# Patient Record
Sex: Female | Born: 1950 | Race: White | Hispanic: No | Marital: Married | Smoking: Never smoker
Health system: Southern US, Community
[De-identification: ages and names within clinical notes are randomized; demographics above are authoritative.]

## PROBLEM LIST (undated history)

## (undated) DIAGNOSIS — G709 Myoneural disorder, unspecified: Secondary | ICD-10-CM

## (undated) DIAGNOSIS — I10 Essential (primary) hypertension: Secondary | ICD-10-CM

## (undated) DIAGNOSIS — E079 Disorder of thyroid, unspecified: Secondary | ICD-10-CM

## (undated) DIAGNOSIS — H269 Unspecified cataract: Secondary | ICD-10-CM

## (undated) DIAGNOSIS — E785 Hyperlipidemia, unspecified: Secondary | ICD-10-CM

## (undated) DIAGNOSIS — L80 Vitiligo: Secondary | ICD-10-CM

## (undated) DIAGNOSIS — Z9221 Personal history of antineoplastic chemotherapy: Secondary | ICD-10-CM

## (undated) DIAGNOSIS — T7840XA Allergy, unspecified, initial encounter: Secondary | ICD-10-CM

## (undated) DIAGNOSIS — F419 Anxiety disorder, unspecified: Secondary | ICD-10-CM

## (undated) DIAGNOSIS — M1990 Unspecified osteoarthritis, unspecified site: Secondary | ICD-10-CM

## (undated) DIAGNOSIS — D649 Anemia, unspecified: Secondary | ICD-10-CM

## (undated) DIAGNOSIS — N19 Unspecified kidney failure: Secondary | ICD-10-CM

## (undated) HISTORY — DX: Hyperlipidemia, unspecified: E78.5

## (undated) HISTORY — DX: Essential (primary) hypertension: I10

## (undated) HISTORY — DX: Myoneural disorder, unspecified: G70.9

## (undated) HISTORY — DX: Vitiligo: L80

## (undated) HISTORY — PX: KNEE SURGERY: SHX244

## (undated) HISTORY — DX: Unspecified osteoarthritis, unspecified site: M19.90

## (undated) HISTORY — DX: Disorder of thyroid, unspecified: E07.9

## (undated) HISTORY — PX: CATARACT EXTRACTION, BILATERAL: SHX1313

## (undated) HISTORY — DX: Anxiety disorder, unspecified: F41.9

## (undated) HISTORY — DX: Anemia, unspecified: D64.9

## (undated) HISTORY — PX: PORTA CATH INSERTION: CATH118285

## (undated) HISTORY — DX: Unspecified cataract: H26.9

## (undated) HISTORY — PX: PORT-A-CATH REMOVAL: SHX5289

## (undated) HISTORY — PX: POLYPECTOMY: SHX149

## (undated) HISTORY — DX: Personal history of antineoplastic chemotherapy: Z92.21

## (undated) HISTORY — DX: Unspecified kidney failure: N19

## (undated) HISTORY — DX: Allergy, unspecified, initial encounter: T78.40XA

## (undated) HISTORY — PX: COLONOSCOPY: SHX174

---

## 1974-07-23 HISTORY — PX: TUBAL LIGATION: SHX77

## 1999-07-24 HISTORY — PX: CHOLECYSTECTOMY: SHX55

## 2007-07-24 DIAGNOSIS — N19 Unspecified kidney failure: Secondary | ICD-10-CM

## 2007-07-24 HISTORY — DX: Unspecified kidney failure: N19

## 2009-07-23 HISTORY — PX: REPLACEMENT TOTAL KNEE: SUR1224

## 2010-01-30 ENCOUNTER — Encounter: Admission: RE | Admit: 2010-01-30 | Discharge: 2010-04-20 | Payer: Self-pay | Admitting: Orthopedic Surgery

## 2011-04-16 DIAGNOSIS — Z96659 Presence of unspecified artificial knee joint: Secondary | ICD-10-CM | POA: Insufficient documentation

## 2013-01-05 ENCOUNTER — Telehealth: Payer: Self-pay | Admitting: General Practice

## 2013-01-05 NOTE — Telephone Encounter (Signed)
appt made

## 2013-01-07 ENCOUNTER — Encounter: Payer: Self-pay | Admitting: General Practice

## 2013-01-07 ENCOUNTER — Ambulatory Visit (INDEPENDENT_AMBULATORY_CARE_PROVIDER_SITE_OTHER): Payer: No Typology Code available for payment source | Admitting: General Practice

## 2013-01-07 VITALS — BP 153/94 | HR 86 | Temp 98.8°F | Ht 66.5 in | Wt 283.5 lb

## 2013-01-07 DIAGNOSIS — M79609 Pain in unspecified limb: Secondary | ICD-10-CM

## 2013-01-07 DIAGNOSIS — R202 Paresthesia of skin: Secondary | ICD-10-CM

## 2013-01-07 DIAGNOSIS — M79602 Pain in left arm: Secondary | ICD-10-CM

## 2013-01-07 DIAGNOSIS — R209 Unspecified disturbances of skin sensation: Secondary | ICD-10-CM

## 2013-01-07 NOTE — Progress Notes (Signed)
  Subjective:    Patient ID: Brianna Kelly, female    DOB: 06/29/1951, 61 y.o.   MRN: [id number]  Arm Pain  The incident occurred 5 to 7 days ago. Incident location: outpatient lab in a medical center. Injury mechanism: labs being drawn from right arm. The pain is present in the left forearm, left fingers, left elbow, left shoulder and left hand. The quality of the pain is described as burning (feels like a bee sting or electric sting). The pain is at a severity of 7/10. The pain is severe. The pain has been intermittent since the incident. Associated symptoms include numbness and tingling. Pertinent negatives include no chest pain. The symptoms are aggravated by movement, lifting and palpation. She has tried rest, NSAIDs, ice, immobilization and acetaminophen for the symptoms. The treatment provided no relief.  Reports having blood drawn from left mid arm on January 01, 2013 and experiencing pain since then.  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 Denies having a safety concern or working in hazardous environment.      Review of Systems  Constitutional: Negative for fever and chills.  HENT: Negative for ear pain, neck pain, neck stiffness and tinnitus.   Respiratory: Negative for chest tightness and shortness of breath.   Cardiovascular: Negative for chest pain and palpitations.  Gastrointestinal: Negative for abdominal pain.  Neurological: Positive for tingling and numbness. Negative for dizziness, weakness and headaches.       Objective:   Physical Exam  Constitutional: She is oriented to person, place, and time. She appears well-developed and well-nourished.  Cardiovascular: Normal rate, regular rhythm, normal heart sounds and normal pulses.   Pulses:      Radial pulses are 2+ on the right side, and 2+ on the left side.  Pulmonary/Chest: Effort normal and breath sounds normal. No respiratory distress. She exhibits no tenderness.  Neurological: She  is alert and oriented to person, place, and time.  Grimacing noted upon light palpation of left arm brachial area. Grossly intact II-XII  Skin: Skin is warm and dry.  Psychiatric: She has a normal mood and affect.          Assessment & Plan:  1. Left arm pain, 2. Paresthesia of left arm - Ambulatory referral to Neurology -rest affected area  -avoid irritating factors -RTO if symptoms worsen -Patient verbalized understanding -Coralie Keens, FNP-C

## 2013-01-07 NOTE — Patient Instructions (Signed)
Paresthesia °Paresthesia is an abnormal burning or prickling sensation. This sensation is generally felt in the hands, arms, legs, or feet. However, it may occur in any part of the body. It is usually not painful. The feeling may be described as: °· Tingling or numbness. °· "Pins and needles." °· Skin crawling. °· Buzzing. °· Limbs "falling asleep." °· Itching. °Most people experience temporary (transient) paresthesia at some time in their lives. °CAUSES  °Paresthesia may occur when you breathe too quickly (hyperventilation). It can also occur without any apparent cause. Commonly, paresthesia occurs when pressure is placed on a nerve. The feeling quickly goes away once the pressure is removed. For some people, however, paresthesia is a long-lasting (chronic) condition caused by an underlying disorder. The underlying disorder may be: °· A traumatic, direct injury to nerves. Examples include a: °· Broken (fractured) neck. °· Fractured skull. °· A disorder affecting the brain and spinal cord (central nervous system). Examples include: °· Transverse myelitis. °· Encephalitis. °· Transient ischemic attack. °· Multiple sclerosis. °· Stroke. °· Tumor or blood vessel problems, such as an arteriovenous malformation pressing against the brain or spinal cord. °· A condition that damages the peripheral nerves (peripheral neuropathy). Peripheral nerves are not part of the brain and spinal cord. These conditions include: °· Diabetes. °· Peripheral vascular disease. °· Nerve entrapment syndromes, such as carpal tunnel syndrome. °· Shingles. °· Hypothyroidism. °· Vitamin B12 deficiencies. °· Alcoholism. °· Heavy metal poisoning (lead, arsenic). °· Rheumatoid arthritis. °· Systemic lupus erythematosus. °DIAGNOSIS  °Your caregiver will attempt to find the underlying cause of your paresthesia. Your caregiver may: °· Take your medical history. °· Perform a physical exam. °· Order various lab tests. °· Order imaging tests. °TREATMENT    °Treatment for paresthesia depends on the underlying cause. °HOME CARE INSTRUCTIONS °· Avoid drinking alcohol. °· You may consider massage or acupuncture to help relieve your symptoms. °· Keep all follow-up appointments as directed by your caregiver. °SEEK IMMEDIATE MEDICAL CARE IF:  °· You feel weak. °· You have trouble walking or moving. °· You have problems with speech or vision. °· You feel confused. °· You cannot control your bladder or bowel movements. °· You feel numbness after an injury. °· You faint. °· Your burning or prickling feeling gets worse when walking. °· You have pain, cramps, or dizziness. °· You develop a rash. °MAKE SURE YOU: °· Understand these instructions. °· Will watch your condition. °· Will get help right away if you are not doing well or get worse. °Document Released: 06/29/2002 Document Revised: 10/01/2011 Document Reviewed: 03/30/2011 °Exit[employer]® Patient Information ©2014 [employer]. ° °

## 2013-01-12 ENCOUNTER — Telehealth: Payer: Self-pay | Admitting: General Practice

## 2013-01-27 ENCOUNTER — Ambulatory Visit (INDEPENDENT_AMBULATORY_CARE_PROVIDER_SITE_OTHER): Payer: No Typology Code available for payment source | Admitting: Neurology

## 2013-01-27 ENCOUNTER — Encounter: Payer: No Typology Code available for payment source | Admitting: Neurology

## 2013-01-27 ENCOUNTER — Encounter: Payer: Self-pay | Admitting: Neurology

## 2013-01-27 VITALS — BP 149/93 | HR 80 | Temp 97.9°F

## 2013-01-27 DIAGNOSIS — R209 Unspecified disturbances of skin sensation: Secondary | ICD-10-CM

## 2013-01-27 DIAGNOSIS — M79609 Pain in unspecified limb: Secondary | ICD-10-CM

## 2013-01-27 DIAGNOSIS — R200 Anesthesia of skin: Secondary | ICD-10-CM

## 2013-01-27 MED ORDER — GABAPENTIN 100 MG PO CAPS: ORAL_CAPSULE | ORAL | Status: DC

## 2013-01-27 NOTE — Patient Instructions (Addendum)
I think overall you are doing fairly well but I do want to suggest a few things today:  Remember to drink plenty of fluid, eat healthy meals and do not skip any meals. Try to eat protein with a every meal and eat a healthy snack such as fruit or nuts in between meals. Try to keep a regular sleep-wake schedule and try to exercise daily, particularly in the form of walking, 20-30 minutes a day, if you can.   Engage in social activities in your community and with your family and try to keep up with current events by reading the newspaper or watching the news.   As far as your medications are concerned, I would like to suggest: Start Neurontin (gabapentin) 100 mg strength: Take 1 pill nightly at bedtime for 1 week, then 2 pills nightly for 1 week, then 3 pills each night thereafter. The most common side effects reported are sedation or sleepiness. Rare side effects include balance problems, confusion.   As far as diagnostic testing: Nerve and muscle electrical testing.  I would like to see you back in 3 months, sooner if we need to. Please call us with any interim questions, concerns, problems, updates or refill requests.  Please also call us for any test results so we can go over those with you on the phone. Brett Canales is my clinical assistant and will answer any of your questions and relay your messages to me and also relay most of my messages to you.  Our phone number is [phone]. We also have an after hours call service for urgent matters and there is a physician on-call for urgent questions. For any emergencies you know to call 911 or go to the nearest emergency room.

## 2013-01-27 NOTE — Progress Notes (Signed)
Subjective:    Patient ID: Brianna Kelly is a 62 y.o. female.  HPI  Huston Foley, MD, PhD [facility] [address], [address] [address] [place], [place] [address]  Dear Dr. Christell Constant,   I saw your patient, Brianna Kelly, upon your kind request in my neurologic clinic today for initial consultation of her Left arm pain. The patient is unaccompanied today. 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 She describes a bee sting or electric sensation that involves the left hand including the fingers, left elbow area, left shoulder and left forearm. The pain is described as intermittent and she denies any chest pain associated with that. Lifting, palpation and movement have been occurring factors. Ice application and nonsteroidal anti-inflammatory medications as well as acetaminophen were tried with no significant relief. She reports having had blood drawn from her left mid arm on 01/01/2013 and reports experiencing pain since then. She pain started after the needle was pulled out from the L antecubital fossa. She describes a burning sensation that starts in the antecubital fossa, radiating to the forearm and to the proximal arm on the L. She has had difficulty performing her job. The pain is constant, even with rest. 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 She started having neck pain and shoulder pain. She was recently seen by Dr. Patton Salles, her kidney doctor, who felt, she may be having problems with arthritis. She was treated with Cytoxan and Prednisone for kidney disease in the past, until 2011 or 2012. She saw a rheumatologist, Dr. Gerrianne Scale, for her pain, who did some blood work.  Her Past Medical  History Is Significant For: Past Medical History  Diagnosis Date  . Thyroid disease   . Hypertension   . Hyperlipidemia   . Vitiligo   . Kidney failure     Her Past Surgical History Is Significant For: Past Surgical History  Procedure Laterality Date  . Replacement total knee    . Cholecystectomy    . Tubal ligation      Her Family History Is Significant For: Family History  Problem Relation Age of Onset  . Obesity Father     died having surgery to remove gallbladder  . Diabetes Father   . Cancer Sister     ovarian  . Lung disease Mother   . Anxiety disorder Mother     Her Social History Is Significant For: History   Social History  . Marital Status: Married    Spouse Name: Ileene Patrick "Brett Canales"    Number of Children: 2  . Years of Education: AA   Occupational History  .      [employer]   Social History Main Topics  . Smoking status: Never Smoker   . Smokeless tobacco: Never Used  . Alcohol Use: No  . Drug Use: No  . Sexually Active: None   Other Topics Concern  . None   Social History Narrative   Pt lives at home with spouse.   Caffeine Use: Very little    Her Allergies Are:  No Known Allergies:   Her Current Medications Are:  Outpatient Encounter Prescriptions as of 01/27/2013  Medication Sig Dispense Refill  . atorvastatin (LIPITOR) 20 MG tablet       .  benazepril (LOTENSIN) 5 MG tablet Take 5 mg by mouth daily.      . clindamycin-benzoyl peroxide (BENZACLIN) gel       . clobetasol cream (TEMOVATE) 0.05 %       . cyclobenzaprine (FLEXERIL) 5 MG tablet Take 10 mg by mouth 3 (three) times daily.      . fluocinonide ointment (LIDEX) 0.05 %       . furosemide (LASIX) 20 MG tablet Take 20 mg by mouth daily.      . Hydrocodone-Acetaminophen 5-300 MG TABS Take 5-300 mg by mouth daily.      Marland Kitchen levothyroxine (SYNTHROID, LEVOTHROID) 100 MCG tablet Take 100 mcg by mouth daily.      . metroNIDAZOLE (METROGEL) 1 % gel       . gabapentin (NEURONTIN) 100 MG capsule  Take 1 pill nightly at bedtime for 1 week, then 2 pills nightly for 1 week, then 3 pills each night thereafter.  90 capsule  3   No facility-administered encounter medications on file as of 01/27/2013.  : Review of Systems  Musculoskeletal: Positive for myalgias and joint swelling.  Psychiatric/Behavioral: Positive for sleep disturbance (snoring).    Objective:  Neurologic Exam  Physical Exam Physical Examination:   Filed Vitals:   01/27/13 0953  BP: 149/93  Pulse: 80  Temp: 97.9 F (36.6 C)    General Examination: The patient is a very pleasant 62 y.o. female in no acute distress. She appears well-developed and well-nourished and well groomed. She is obese.  HEENT: Normocephalic, atraumatic, pupils are equal, round and reactive to light and accommodation. Funduscopic exam is normal with sharp disc margins noted. Extraocular tracking is good without limitation to gaze excursion or nystagmus noted. Normal smooth pursuit is noted. Hearing is grossly intact. Tympanic membranes are clear bilaterally. Face is symmetric with normal facial animation and normal facial sensation. Speech is clear with no dysarthria noted. There is no hypophonia. There is no lip, neck/head, jaw or voice tremor. Neck is supple with full range of passive and active motion. There are no carotid bruits on auscultation. Oropharynx exam reveals: mild mouth dryness, adequate dental hygiene and moderate airway crowding, due to narrow airway. Mallampati is class II. Tongue protrudes centrally and palate elevates symmetrically.    Chest: Clear to auscultation without wheezing, rhonchi or crackles noted.  Heart: S1+S2+0, regular and normal without murmurs, rubs or gallops noted.   Abdomen: Soft, non-tender and non-distended with normal bowel sounds appreciated on auscultation.  Extremities: There is no pitting edema in the distal lower extremities bilaterally. Pedal pulses are intact.  Skin: Warm and dry without trophic  changes noted. There are no varicose veins.  Musculoskeletal: exam reveals no obvious joint deformities, tenderness or joint swelling or erythema.   Neurologically:  Mental status: The patient is awake, alert and oriented in all 4 spheres. Her memory, attention, language and knowledge are appropriate. There is no aphasia, agnosia, apraxia or anomia. Speech is clear with normal prosody and enunciation. Thought process is linear. Mood is congruent and affect is blunted.  Cranial nerves are as described above under HEENT exam. In addition, shoulder shrug is normal with equal shoulder height noted. Motor exam: Normal bulk, strength and tone is noted. There is no drift, tremor or rebound. Romberg is negative. Reflexes are 1+ throughout. Fine motor skills are intact with normal finger taps, normal hand movements, normal rapid alternating patting, normal foot taps and normal foot agility.  Cerebellar testing shows no dysmetria or intention tremor on  finger to nose testing. Heel to shin is unremarkable bilaterally. There is no truncal or gait ataxia.  Sensory exam is intact to light touch, pinprick, vibration, temperature sense and proprioception in the upper and lower extremities, with the exception of a decrease in PP sensation in the L forearm.  Gait, station and balance are unremarkable. No veering to one side is noted. No leaning to one side is noted. Posture is age-appropriate and stance is narrow based. No problems turning are noted. She turns en bloc. Tandem walk is unremarkable. Intact toe and heel stance is noted.               Assessment and Plan:   Assessment and Plan:  In summary, Charne Mcbrien is a very pleasant 62 y.o.-year old female with a history of L arm tingling and burning pain for the past month, which started after routine venipuncture.  Her physical exam is for the most part non-focal, with no weakness detected, but she has mild decrease in PP sensation in the L forearm.  I had a long  chat with the patient about her symptoms, my findings and I suggested further w/u with EMG and NCV testing to her L arm. I explained, that if the symptoms started after a blood draw, she will most likely get better with time. For symptomatic treatment I suggested a trial of gabapentin starting at 100 mg strength one pill at night only. She will gradually increase it at nighttime only to up to 300 mg nightly. I did talk to her about the sedating effects of gabapentin and also that it is renally cleared. However at 300 mg strength it is not going to adversely affect her kidney function. She states that her kidney function has been good. She has been advised that it can be sedating enough to affect her driving or to affect her vigilance at work. Therefore she's not to take it during the day. If she has residual drowsiness during the day she is to keep it at a lower dose perhaps one or 2 pills only at night. She understood and was in agreement. I will see her back for reevaluation in about 3 months, sooner if the need arises. She is advised to call with any interim questions, concerns, problems or updates and refill requests and test results.   Thank you very much for allowing me to participate in the care of this nice patient. If I can be of any further assistance to you please do not hesitate to call me at [phone].  Sincerely,   Huston Foley, MD, PhD

## 2013-02-09 ENCOUNTER — Ambulatory Visit (INDEPENDENT_AMBULATORY_CARE_PROVIDER_SITE_OTHER): Payer: No Typology Code available for payment source | Admitting: Neurology

## 2013-02-09 ENCOUNTER — Encounter (INDEPENDENT_AMBULATORY_CARE_PROVIDER_SITE_OTHER): Payer: No Typology Code available for payment source | Admitting: Radiology

## 2013-02-09 DIAGNOSIS — R209 Unspecified disturbances of skin sensation: Secondary | ICD-10-CM

## 2013-02-09 DIAGNOSIS — Z0289 Encounter for other administrative examinations: Secondary | ICD-10-CM

## 2013-02-09 DIAGNOSIS — M79609 Pain in unspecified limb: Secondary | ICD-10-CM

## 2013-02-09 DIAGNOSIS — R200 Anesthesia of skin: Secondary | ICD-10-CM

## 2013-02-09 DIAGNOSIS — R202 Paresthesia of skin: Secondary | ICD-10-CM | POA: Insufficient documentation

## 2013-02-09 NOTE — Progress Notes (Signed)
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 L wrist splint would give relief. Pt to call back if questions. ______

## 2013-02-09 NOTE — Procedures (Signed)
    [facility]  NCS (NERVE CONDUCTION STUDY) WITH EMG (ELECTROMYOGRAPHY) REPORT   STUDY DATE: February 09, 2013 PATIENT NAME: Brianna Kelly DOB: [birth year] MRN: [id number]    TECHNOLOGIST: Kaylyn Lim ELECTROMYOGRAPHER: Levert Feinstein M.D.  CLINICAL INFORMATION:   History of present illness: 62 years old Caucasian right handed female, had left antecubital fossa  phlebectomy in June 11th 2014, she felt instant left dorsum hand pain, discomfort, numbness along her left dorsum left arm after the needle was taken out, symptoms has been persistent since, she denies weakness, mild improvement with gabapentin treatment.  On examination: bilateral upper extremity motor strength was normal, there was no persistent sensory loss, deep tendon reflex was present and symmetric  FINDINGS: NERVE CONDUCTION STUDY: Bilateral ulnar and radial sensory responses were normal.  Bilateral median sensory moderately prolonged distal latency, moderately prolonged peak latency, with normal snap amplitude.  Bilateral median nerve responses showed moderately prolonged distal latency, right worse than left, was normal C. map amplitude, conduction velocity, and F-wave latency.  Bilateral ulnar motor responses were normal  NEEDLE ELECTROMYOGRAPHY: Selected needle examination was performed at left upper extremity muscles    needle examination of left pronator teres, flexor carpi ulnaris, first dorsal interossei, extensor digitorum communis, biceps, triceps, deltoid was normal.  IMPRESSION:   This is an abnormal study. There is electrodiagnostic evidence of bilateral median neuropathy across the wrist, consistent with bilateral carpal tunnel syndromes, moderate degree bilaterally, right worse than left.    INTERPRETING PHYSICIAN:   Levert Feinstein M.D. Ph.D. [facility] [address] [place], [place] [address] [phone]

## 2013-02-09 NOTE — Progress Notes (Signed)
Quick Note:  Please call patient and advise her that her recent EMG and nerve conduction test which is the electrical test on nerves and muscle in her upper extremities showed findings consistent with bilateral carpal tunnel syndrome, right more so than left. She may be able to get relief of her left arm symptoms by using an over-the-counter splint for carpal tunnel. But the electrical test shows findings in both hands even though her pain is only on the left. ______

## 2013-05-04 ENCOUNTER — Ambulatory Visit: Payer: No Typology Code available for payment source | Admitting: Neurology

## 2013-05-05 ENCOUNTER — Ambulatory Visit: Payer: No Typology Code available for payment source | Admitting: Neurology

## 2013-06-16 ENCOUNTER — Encounter: Payer: Self-pay | Admitting: General Practice

## 2013-06-16 ENCOUNTER — Ambulatory Visit (INDEPENDENT_AMBULATORY_CARE_PROVIDER_SITE_OTHER): Payer: No Typology Code available for payment source | Admitting: General Practice

## 2013-06-16 VITALS — BP 136/87 | HR 83 | Temp 98.1°F | Ht 66.5 in | Wt 288.0 lb

## 2013-06-16 DIAGNOSIS — E785 Hyperlipidemia, unspecified: Secondary | ICD-10-CM

## 2013-06-16 DIAGNOSIS — I10 Essential (primary) hypertension: Secondary | ICD-10-CM

## 2013-06-16 DIAGNOSIS — T148XXA Other injury of unspecified body region, initial encounter: Secondary | ICD-10-CM

## 2013-06-16 DIAGNOSIS — E039 Hypothyroidism, unspecified: Secondary | ICD-10-CM

## 2013-06-16 DIAGNOSIS — M545 Low back pain: Secondary | ICD-10-CM

## 2013-06-16 LAB — POCT UA - MICROSCOPIC ONLY: WBC, Ur, HPF, POC: NEGATIVE

## 2013-06-16 LAB — POCT URINALYSIS DIPSTICK
Glucose, UA: NEGATIVE
Leukocytes, UA: NEGATIVE
Spec Grav, UA: 1.01
Urobilinogen, UA: NEGATIVE

## 2013-06-16 MED ORDER — HYDROCODONE-ACETAMINOPHEN 5-325 MG PO TABS: 1.0000 | ORAL_TABLET | Freq: Two times a day (BID) | ORAL | Status: DC | PRN

## 2013-06-16 MED ORDER — FUROSEMIDE 20 MG PO TABS: 20.0000 mg | ORAL_TABLET | Freq: Every day | ORAL | Status: DC

## 2013-06-16 MED ORDER — LEVOTHYROXINE SODIUM 100 MCG PO TABS: 100.0000 ug | ORAL_TABLET | Freq: Every day | ORAL | Status: DC

## 2013-06-16 MED ORDER — BENAZEPRIL HCL 5 MG PO TABS: 5.0000 mg | ORAL_TABLET | Freq: Every day | ORAL | Status: DC

## 2013-06-16 MED ORDER — CYCLOBENZAPRINE HCL 10 MG PO TABS: 10.0000 mg | ORAL_TABLET | Freq: Every day | ORAL | Status: DC

## 2013-06-16 MED ORDER — ATORVASTATIN CALCIUM 20 MG PO TABS: 20.0000 mg | ORAL_TABLET | Freq: Every day | ORAL | Status: DC

## 2013-06-16 NOTE — Progress Notes (Signed)
Subjective:    Patient ID: Brianna Kelly, female    DOB: 11-13-1950, 62 y.o.   MRN: [id number]  HPI Patient presents today for chronic health check up. She has a history of hypertension, hyperlipidemia, and  Hypothyroidism. She reports taking medications as prescribed. She reports regular water aerobics exercise 5 days a week. Denies healthy eating habits.      Review of Systems  Constitutional: Negative for fever and chills.  Respiratory: Negative for chest tightness and shortness of breath.   Cardiovascular: Negative for chest pain and palpitations.  Gastrointestinal: Negative for nausea, vomiting, abdominal pain, diarrhea and blood in stool.  Genitourinary: Negative for dysuria and difficulty urinating.  Musculoskeletal: Positive for back pain.       Reports some muscle tightness in lower back  Neurological: Negative for dizziness, weakness and headaches.       Objective:   Physical Exam  Constitutional: She is oriented to person, place, and time. She appears well-developed and well-nourished.  HENT:  Head: Normocephalic and atraumatic.  Right Ear: External ear normal.  Left Ear: External ear normal.  Eyes: EOM are normal. Pupils are equal, round, and reactive to light.  Neck: Normal range of motion. Neck supple. No thyromegaly present.  Cardiovascular: Normal rate, regular rhythm and normal heart sounds.   Pulmonary/Chest: Effort normal and breath sounds normal. No respiratory distress. She exhibits no tenderness.  Abdominal: Soft. Bowel sounds are normal. She exhibits no distension. There is no tenderness.  Musculoskeletal: She exhibits tenderness.  Tenderness and tightness noted to left lumbar area  Lymphadenopathy:    She has no cervical adenopathy.  Neurological: She is alert and oriented to person, place, and time.  Skin: Skin is warm and dry.  Psychiatric: She has a normal mood and affect.     Results for orders placed in visit on 06/16/13  POCT URINALYSIS DIPSTICK       Result Value Range   Color, UA amber     Clarity, UA clear     Glucose, UA neg     Bilirubin, UA neg     Ketones, UA neg     Spec Grav, UA 1.010     Blood, UA trace     pH, UA 7.5     Protein, UA 300     Urobilinogen, UA negative     Nitrite, UA neg     Leukocytes, UA Negative    POCT UA - MICROSCOPIC ONLY      Result Value Range   WBC, Ur, HPF, POC neg     RBC, urine, microscopic 5-10     Bacteria, U Microscopic neg     Mucus, UA neg     Epithelial cells, urine per micros occ     Crystals, Ur, HPF, POC neg     Casts, Ur, LPF, POC neg     Yeast, UA neg         Assessment & Plan:   1. Hypertension  - CMP14+EGFR - benazepril (LOTENSIN) 5 MG tablet; Take 1 tablet (5 mg total) by mouth daily.  Dispense: 30 tablet; Refill: 3 - furosemide (LASIX) 20 MG tablet; Take 1 tablet (20 mg total) by mouth daily.  Dispense: 30 tablet; Refill: 3  2. Hyperlipidemia  - NMR, lipoprofile - atorvastatin (LIPITOR) 20 MG tablet; Take 1 tablet (20 mg total) by mouth daily at 6 PM.  Dispense: 30 tablet; Refill: 3  3. Hypothyroidism  - Thyroid Panel With TSH - levothyroxine (SYNTHROID, LEVOTHROID) 100 MCG tablet;  Take 1 tablet (100 mcg total) by mouth daily.  Dispense: 30 tablet; Refill: 3  4. Low back pain  - POCT urinalysis dipstick - POCT UA - Microscopic Only - HYDROcodone-acetaminophen (NORCO/VICODIN) 5-325 MG per tablet; Take 1 tablet by mouth every 12 (twelve) hours as needed for moderate pain.  Dispense: 30 tablet; Refill: 0 -narcotic prescription wouldn't be written again, if back pain continues will need further evaluated by ortho specialist -discussed if pain continues will need to see ortho specialist  5. Muscle strain  - cyclobenzaprine (FLEXERIL) 10 MG tablet; Take 1 tablet (10 mg total) by mouth at bedtime.  Dispense: 20 tablet; Refill: 0 -Continue all current medications Labs pending, cmp, lipid panel F/u in 3 months Discussed exercise and diet  Patient  verbalized understanding Coralie Keens, FNP-C

## 2013-06-16 NOTE — Patient Instructions (Addendum)
Hypertriglyceridemia  Diet for High blood levels of Triglycerides Most fats in food are triglycerides. Triglycerides in your blood are stored as fat in your body. High levels of triglycerides in your blood may put you at a greater risk for heart disease and stroke.  Normal triglyceride levels are less than 150 mg/dL. Borderline high levels are 150-199 mg/dl. High levels are 200 - 499 mg/dL, and very high triglyceride levels are greater than 500 mg/dL. The decision to treat high triglycerides is generally based on the level. For people with borderline or high triglyceride levels, treatment includes weight loss and exercise. Drugs are recommended for people with very high triglyceride levels. Many people who need treatment for high triglyceride levels have metabolic syndrome. This syndrome is a collection of disorders that often include: insulin resistance, high blood pressure, blood clotting problems, high cholesterol and triglycerides. TESTING PROCEDURE FOR TRIGLYCERIDES  You should not eat 4 hours before getting your triglycerides measured. The normal range of triglycerides is between 10 and 250 milligrams per deciliter (mg/dl). Some people may have extreme levels (1000 or above), but your triglyceride level may be too high if it is above 150 mg/dl, depending on what other risk factors you have for heart disease.  People with high blood triglycerides may also have high blood cholesterol levels. If you have high blood cholesterol as well as high blood triglycerides, your risk for heart disease is probably greater than if you only had high triglycerides. High blood cholesterol is one of the main risk factors for heart disease. CHANGING YOUR DIET  Your weight can affect your blood triglyceride level. If you are more than 20% above your ideal body weight, you may be able to lower your blood triglycerides by losing weight. Eating less and exercising regularly is the best way to combat this. Fat provides more  calories than any other food. The best way to lose weight is to eat less fat. Only 30% of your total calories should come from fat. Less than 7% of your diet should come from saturated fat. A diet low in fat and saturated fat is the same as a diet to decrease blood cholesterol. By eating a diet lower in fat, you may lose weight, lower your blood cholesterol, and lower your blood triglyceride level.  Eating a diet low in fat, especially saturated fat, may also help you lower your blood triglyceride level. Ask your dietitian to help you figure how much fat you can eat based on the number of calories your caregiver has prescribed for you.  Exercise, in addition to helping with weight loss may also help lower triglyceride levels.   Alcohol can increase blood triglycerides. You may need to stop drinking alcoholic beverages.  Too much carbohydrate in your diet may also increase your blood triglycerides. Some complex carbohydrates are necessary in your diet. These may include bread, rice, potatoes, other starchy vegetables and cereals.  Reduce "simple" carbohydrates. These may include pure sugars, candy, honey, and jelly without losing other nutrients. If you have the kind of high blood triglycerides that is affected by the amount of carbohydrates in your diet, you will need to eat less sugar and less high-sugar foods. Your caregiver can help you with this.  Adding 2-4 grams of fish oil (EPA+ DHA) may also help lower triglycerides. Speak with your caregiver before adding any supplements to your regimen. Following the Diet  Maintain your ideal weight. Your caregivers can help you with a diet. Generally, eating less food and getting more   exercise will help you lose weight. Joining a weight control group may also help. Ask your caregivers for a good weight control group in your area.  Eat low-fat foods instead of high-fat foods. This can help you lose weight too.  These foods are lower in fat. Eat MORE of these:    Dried beans, peas, and lentils.  Egg whites.  Low-fat cottage cheese.  Fish.  Lean cuts of meat, such as round, sirloin, rump, and flank (cut extra fat off meat you fix).  Whole grain breads, cereals and pasta.  Skim and nonfat dry milk.  Low-fat yogurt.  Poultry without the skin.  Cheese made with skim or part-skim milk, such as mozzarella, parmesan, farmers', ricotta, or pot cheese. These are higher fat foods. Eat LESS of these:   Whole milk and foods made from whole milk, such as American, blue, cheddar, monterey jack, and swiss cheese  High-fat meats, such as luncheon meats, sausages, knockwurst, bratwurst, hot dogs, ribs, corned beef, ground pork, and regular ground beef.  Fried foods. Limit saturated fats in your diet. Substituting unsaturated fat for saturated fat may decrease your blood triglyceride level. You will need to read package labels to know which products contain saturated fats.  These foods are high in saturated fat. Eat LESS of these:   Fried pork skins.  Whole milk.  Skin and fat from poultry.  Palm oil.  Butter.  Shortening.  Cream cheese.  Bacon.  Margarines and baked goods made from listed oils.  Vegetable shortenings.  Chitterlings.  Fat from meats.  Coconut oil.  Palm kernel oil.  Lard.  Cream.  Sour cream.  Fatback.  Coffee whiteners and non-dairy creamers made with these oils.  Cheese made from whole milk. Use unsaturated fats (both polyunsaturated and monounsaturated) moderately. Remember, even though unsaturated fats are better than saturated fats; you still want a diet low in total fat.  These foods are high in unsaturated fat:   Canola oil.  Sunflower oil.  Mayonnaise.  Almonds.  Peanuts.  Pine nuts.  Margarines made with these oils.  Safflower oil.  Olive oil.  Avocados.  Cashews.  Peanut butter.  Sunflower seeds.  Soybean oil.  Peanut  oil.  Olives.  Pecans.  Walnuts.  Pumpkin seeds. Avoid sugar and other high-sugar foods. This will decrease carbohydrates without decreasing other nutrients. Sugar in your food goes rapidly to your blood. When there is excess sugar in your blood, your liver may use it to make more triglycerides. Sugar also contains calories without other important nutrients.  Eat LESS of these:   Sugar, brown sugar, powdered sugar, jam, jelly, preserves, honey, syrup, molasses, pies, candy, cakes, cookies, frosting, pastries, colas, soft drinks, punches, fruit drinks, and regular gelatin.  Avoid alcohol. Alcohol, even more than sugar, may increase blood triglycerides. In addition, alcohol is high in calories and low in nutrients. Ask for sparkling water, or a diet soft drink instead of an alcoholic beverage. Suggestions for planning and preparing meals   Bake, broil, grill or roast meats instead of frying.  Remove fat from meats and skin from poultry before cooking.  Add spices, herbs, lemon juice or vinegar to vegetables instead of salt, rich sauces or gravies.  Use a non-stick skillet without fat or use no-stick sprays.  Cool and refrigerate stews and broth. Then remove the hardened fat floating on the surface before serving.  Refrigerate meat drippings and skim off fat to make low-fat gravies.  Serve more fish.  Use less butter,   margarine and other high-fat spreads on bread or vegetables.  Use skim or reconstituted non-fat dry milk for cooking.  Cook with low-fat cheeses.  Substitute low-fat yogurt or cottage cheese for all or part of the sour cream in recipes for sauces, dips or congealed salads.  Use half yogurt/half mayonnaise in salad recipes.  Substitute evaporated skim milk for cream. Evaporated skim milk or reconstituted non-fat dry milk can be whipped and substituted for whipped cream in certain recipes.  Choose fresh fruits for dessert instead of high-fat foods such as pies or  cakes. Fruits are naturally low in fat. When Dining Out   Order low-fat appetizers such as fruit or vegetable juice, pasta with vegetables or tomato sauce.  Select clear, rather than cream soups.  Ask that dressings and gravies be served on the side. Then use less of them.  Order foods that are baked, broiled, poached, steamed, stir-fried, or roasted.  Ask for margarine instead of butter, and use only a small amount.  Drink sparkling water, unsweetened tea or coffee, or diet soft drinks instead of alcohol or other sweet beverages. QUESTIONS AND ANSWERS ABOUT OTHER FATS IN THE BLOOD: SATURATED FAT, TRANS FAT, AND CHOLESTEROL What is trans fat? Trans fat is a type of fat that is formed when vegetable oil is hardened through a process called hydrogenation. This process helps makes foods more solid, gives them shape, and prolongs their shelf life. Trans fats are also called hydrogenated or partially hydrogenated oils.  What do saturated fat, trans fat, and cholesterol in foods have to do with heart disease? Saturated fat, trans fat, and cholesterol in the diet all raise the level of LDL "bad" cholesterol in the blood. The higher the LDL cholesterol, the greater the risk for coronary heart disease (CHD). Saturated fat and trans fat raise LDL similarly.  What foods contain saturated fat, trans fat, and cholesterol? High amounts of saturated fat are found in animal products, such as fatty cuts of meat, chicken skin, and full-fat dairy products like butter, whole milk, cream, and cheese, and in tropical vegetable oils such as palm, palm kernel, and coconut oil. Trans fat is found in some of the same foods as saturated fat, such as vegetable shortening, some margarines (especially hard or stick margarine), crackers, cookies, baked goods, fried foods, salad dressings, and other processed foods made with partially hydrogenated vegetable oils. Small amounts of trans fat also occur naturally in some animal  products, such as milk products, beef, and lamb. Foods high in cholesterol include liver, other organ meats, egg yolks, shrimp, and full-fat dairy products. How can I use the new food label to make heart-healthy food choices? Check the Nutrition Facts panel of the food label. Choose foods lower in saturated fat, trans fat, and cholesterol. For saturated fat and cholesterol, you can also use the Percent Daily Value (%DV): 5% DV or less is low, and 20% DV or more is high. (There is no %DV for trans fat.) Use the Nutrition Facts panel to choose foods low in saturated fat and cholesterol, and if the trans fat is not listed, read the ingredients and limit products that list shortening or hydrogenated or partially hydrogenated vegetable oil, which tend to be high in trans fat. POINTS TO REMEMBER:   Discuss your risk for heart disease with your caregivers, and take steps to reduce risk factors.  Change your diet. Choose foods that are low in saturated fat, trans fat, and cholesterol.  Add exercise to your daily routine if   it is not already being done. Participate in physical activity of moderate intensity, like brisk walking, for at least 30 minutes on most, and preferably all days of the week. No time? Break the 30 minutes into three, 10-minute segments during the day.  Stop smoking. If you do smoke, contact your caregiver to discuss ways in which they can help you quit.  Do not use street drugs.  Maintain a normal weight.  Maintain a healthy blood pressure.  Keep up with your blood work for checking the fats in your blood as directed by your caregiver. Document Released: 04/26/2004 Document Revised: 01/08/2012 Document Reviewed: 11/22/2008 [facility] Patient Information 2014 [place], [place].  Muscle Strain Muscle strain occurs when a muscle is stretched beyond its normal length. A small number of muscle fibers generally are torn. This is especially common in athletes. This happens when a sudden,  violent force placed on a muscle stretches it too far. Usually, recovery from muscle strain takes 1 to 2 weeks. Complete healing will take 5 to 6 weeks.  HOME CARE INSTRUCTIONS   While awake, apply ice to the sore muscle for the first 2 days after the injury.  Put ice in a plastic bag.  Place a towel between your skin and the bag.  Leave the ice on for 15-20 minutes each hour.  Do not use the strained muscle for several days, until you no longer have pain.  You may wrap the injured area with an elastic bandage for comfort. Be careful not to wrap it too tightly. This may interfere with blood circulation or increase swelling.  Only take over-the-counter or prescription medicines for pain, discomfort, or fever as directed by your caregiver. SEEK MEDICAL CARE IF:  You have increasing pain or swelling in the injured area. MAKE SURE YOU:   Understand these instructions.  Will watch your condition.  Will get help right away if you are not doing well or get worse. Document Released: 07/09/2005 Document Revised: 10/01/2011 Document Reviewed: 07/21/2011 [facility] Patient Information 2014 [place], [place].

## 2013-06-18 LAB — CMP14+EGFR
Albumin/Globulin Ratio: 1.5 (ref 1.1–2.5)
Albumin: 4 g/dL (ref 3.6–4.8)
BUN: 15 mg/dL (ref 8–27)
GFR calc Af Amer: 98 mL/min/{1.73_m2} (ref >59)
GFR calc non Af Amer: 85 mL/min/{1.73_m2} (ref >59)
Glucose: 87 mg/dL (ref 65–99)
Total Bilirubin: 0.5 mg/dL (ref 0.0–1.2)
Total Protein: 6.6 g/dL (ref 6.0–8.5)

## 2013-06-18 LAB — NMR, LIPOPROFILE
Cholesterol: 117 mg/dL (ref <200)
LDL Particle Number: 863 nmol/L (ref <1000)
LDL Size: 20.5 nm — ABNORMAL LOW (ref >20.5)
Small LDL Particle Number: 633 nmol/L — ABNORMAL HIGH (ref <=527)
Triglycerides by NMR: 178 mg/dL — ABNORMAL HIGH (ref <150)

## 2013-06-18 LAB — THYROID PANEL WITH TSH: T3 Uptake Ratio: 27 % (ref 24–39)

## 2013-07-20 ENCOUNTER — Telehealth: Payer: Self-pay | Admitting: Family Medicine

## 2013-08-10 NOTE — Telephone Encounter (Signed)
Left message, labs are WNL except cholesterol up some. Need to exercise and diet.

## 2013-09-03 ENCOUNTER — Encounter: Payer: Self-pay | Admitting: *Deleted

## 2013-09-16 ENCOUNTER — Ambulatory Visit: Payer: No Typology Code available for payment source | Admitting: General Practice

## 2013-09-18 ENCOUNTER — Ambulatory Visit: Payer: No Typology Code available for payment source | Admitting: General Practice

## 2013-10-02 ENCOUNTER — Ambulatory Visit: Payer: No Typology Code available for payment source | Admitting: General Practice

## 2013-10-16 ENCOUNTER — Ambulatory Visit (INDEPENDENT_AMBULATORY_CARE_PROVIDER_SITE_OTHER): Payer: No Typology Code available for payment source | Admitting: General Practice

## 2013-10-16 ENCOUNTER — Encounter: Payer: Self-pay | Admitting: General Practice

## 2013-10-16 ENCOUNTER — Ambulatory Visit (INDEPENDENT_AMBULATORY_CARE_PROVIDER_SITE_OTHER): Payer: No Typology Code available for payment source

## 2013-10-16 VITALS — BP 111/70 | HR 90 | Temp 98.4°F | Ht 67.0 in | Wt 285.0 lb

## 2013-10-16 DIAGNOSIS — E785 Hyperlipidemia, unspecified: Secondary | ICD-10-CM

## 2013-10-16 DIAGNOSIS — E039 Hypothyroidism, unspecified: Secondary | ICD-10-CM

## 2013-10-16 DIAGNOSIS — Z01419 Encounter for gynecological examination (general) (routine) without abnormal findings: Secondary | ICD-10-CM

## 2013-10-16 DIAGNOSIS — M545 Low back pain, unspecified: Secondary | ICD-10-CM

## 2013-10-16 DIAGNOSIS — Z124 Encounter for screening for malignant neoplasm of cervix: Secondary | ICD-10-CM

## 2013-10-16 DIAGNOSIS — Z8041 Family history of malignant neoplasm of ovary: Secondary | ICD-10-CM

## 2013-10-16 DIAGNOSIS — IMO0002 Reserved for concepts with insufficient information to code with codable children: Secondary | ICD-10-CM

## 2013-10-16 DIAGNOSIS — I10 Essential (primary) hypertension: Secondary | ICD-10-CM

## 2013-10-16 MED ORDER — ATORVASTATIN CALCIUM 20 MG PO TABS
20.0000 mg | ORAL_TABLET | Freq: Every day | ORAL | Status: DC
Start: 2013-10-16 — End: 2015-04-18

## 2013-10-16 MED ORDER — LEVOTHYROXINE SODIUM 100 MCG PO TABS: 100.0000 ug | ORAL_TABLET | Freq: Every day | ORAL | Status: DC

## 2013-10-16 MED ORDER — BENAZEPRIL HCL 5 MG PO TABS: 5.0000 mg | ORAL_TABLET | Freq: Every day | ORAL | Status: DC

## 2013-10-16 MED ORDER — FUROSEMIDE 20 MG PO TABS: 20.0000 mg | ORAL_TABLET | Freq: Every day | ORAL | Status: DC

## 2013-10-16 NOTE — Progress Notes (Signed)
   Subjective:    Patient ID: Brianna Kelly, female    DOB: 10/26/1950, 64 y.o.   MRN: [id number]  HPI Patient presents today for an annual exam. History of hyperlipidemia, htn, low back pain, and hypothyroidism. Taking medications as prescribed.    Reports family history of ovarian cancer and requesting CA 125.  Review of Systems  Constitutional: Negative for fever and chills.  Respiratory: Negative for chest tightness and shortness of breath.   Cardiovascular: Negative for chest pain and palpitations.       Objective:   Physical Exam  Constitutional: She is oriented to person, place, and time. She appears well-developed and well-nourished.  HENT:  Head: Normocephalic and atraumatic.  Right Ear: External ear normal.  Left Ear: External ear normal.  Mouth/Throat: Oropharynx is clear and moist.  Eyes: Conjunctivae and EOM are normal. Pupils are equal, round, and reactive to light.  Neck: Normal range of motion. Neck supple. No thyromegaly present.  Cardiovascular: Normal rate, regular rhythm, normal heart sounds and intact distal pulses.   Pulmonary/Chest: Effort normal and breath sounds normal. No respiratory distress. She exhibits no tenderness. Right breast exhibits no inverted nipple, no mass, no nipple discharge, no skin change and no tenderness. Left breast exhibits no inverted nipple, no mass, no nipple discharge, no skin change and no tenderness. Breasts are symmetrical.  Abdominal: Soft. Bowel sounds are normal. She exhibits no distension.  Genitourinary: Vagina normal and uterus normal. No breast swelling, tenderness, discharge or bleeding. No labial fusion. There is no rash, tenderness, lesion or injury on the right labia. There is no rash, tenderness, lesion or injury on the left labia. Uterus is not deviated, not enlarged, not fixed and not tender. Cervix exhibits no motion tenderness, no discharge and no friability. Right adnexum displays no mass, no tenderness and no fullness.  Left adnexum displays no mass, no tenderness and no fullness. No erythema, tenderness or bleeding around the vagina. No foreign body around the vagina. No signs of injury around the vagina. No vaginal discharge found.  Lymphadenopathy:    She has no cervical adenopathy.  Neurological: She is alert and oriented to person, place, and time.  Skin: Skin is warm and dry.  Psychiatric: She has a normal mood and affect.    [facility] reading (PRIMARY) by Erby Pian, FNP-C, degenerative disc disease.       Assessment & Plan:  1. Hypertension  - CMP14+EGFR - benazepril (LOTENSIN) 5 MG tablet; Take 1 tablet (5 mg total) by mouth daily.  Dispense: 30 tablet; Refill: 5 - furosemide (LASIX) 20 MG tablet; Take 1 tablet (20 mg total) by mouth daily.  Dispense: 30 tablet; Refill: 5  2. Hypothyroidism  - Thyroid Panel With TSH - levothyroxine (SYNTHROID, LEVOTHROID) 100 MCG tablet; Take 1 tablet (100 mcg total) by mouth daily.  Dispense: 30 tablet; Refill: 5  3. Hyperlipidemia  - Lipid panel - atorvastatin (LIPITOR) 20 MG tablet; Take 1 tablet (20 mg total) by mouth daily at 6 PM.  Dispense: 30 tablet; Refill: 5  4. Encounter for routine gynecological examination  - Pap IG w/ reflex to HPV when ASC-U  5. Family history of ovarian cancer  - CA 125  6. Low back pain  - DG Lumbar Spine Complete; Future -will refer to ortho if symptoms worsen Continue all current medications Labs pending F/u in 3 months Discussed benefits of regular exercise and healthy eating Patient verbalized understanding Erby Pian, FNP-C

## 2013-10-16 NOTE — Patient Instructions (Signed)

## 2013-10-17 LAB — CMP14+EGFR
ALBUMIN: 4.2 g/dL (ref 3.6–4.8)
ALT: 21 IU/L (ref 0–32)
AST: 15 IU/L (ref 0–40)
Albumin/Globulin Ratio: 1.6 (ref 1.1–2.5)
Alkaline Phosphatase: 122 IU/L — ABNORMAL HIGH (ref 39–117)
BILIRUBIN TOTAL: 0.7 mg/dL (ref 0.0–1.2)
BUN/Creatinine Ratio: 20 (ref 11–26)
BUN: 17 mg/dL (ref 8–27)
CALCIUM: 9 mg/dL (ref 8.7–10.3)
CO2: 24 mmol/L (ref 18–29)
Chloride: 99 mmol/L (ref 97–108)
Creatinine, Ser: 0.84 mg/dL (ref 0.57–1.00)
GFR, EST AFRICAN AMERICAN: 87 mL/min/{1.73_m2} (ref >59)
GFR, EST NON AFRICAN AMERICAN: 75 mL/min/{1.73_m2} (ref >59)
GLUCOSE: 95 mg/dL (ref 65–99)
Globulin, Total: 2.6 g/dL (ref 1.5–4.5)
Potassium: 3.9 mmol/L (ref 3.5–5.2)
Sodium: 143 mmol/L (ref 134–144)
Total Protein: 6.8 g/dL (ref 6.0–8.5)

## 2013-10-17 LAB — LIPID PANEL
CHOLESTEROL TOTAL: 127 mg/dL (ref 100–199)
Chol/HDL Ratio: 2.5 ratio units (ref 0.0–4.4)
HDL: 50 mg/dL (ref >39)
LDL CALC: 54 mg/dL (ref 0–99)
Triglycerides: 116 mg/dL (ref 0–149)
VLDL CHOLESTEROL CAL: 23 mg/dL (ref 5–40)

## 2013-10-17 LAB — THYROID PANEL WITH TSH
Free Thyroxine Index: 2.6 (ref 1.2–4.9)
T3 Uptake Ratio: 27 % (ref 24–39)
T4 TOTAL: 9.8 ug/dL (ref 4.5–12.0)
TSH: 1.4 u[IU]/mL (ref 0.450–4.500)

## 2013-10-17 LAB — CA 125: CA 125: 9.1 U/mL (ref 0.0–34.0)

## 2013-10-19 LAB — PAP IG W/ RFLX HPV ASCU: PAP Smear Comment: 0

## 2013-11-10 ENCOUNTER — Ambulatory Visit: Payer: 59 | Attending: Orthopaedic Surgery | Admitting: Physical Therapy

## 2013-11-10 DIAGNOSIS — M545 Low back pain, unspecified: Secondary | ICD-10-CM | POA: Insufficient documentation

## 2013-11-10 DIAGNOSIS — IMO0001 Reserved for inherently not codable concepts without codable children: Secondary | ICD-10-CM | POA: Insufficient documentation

## 2013-11-12 ENCOUNTER — Ambulatory Visit: Payer: 59 | Admitting: Physical Therapy

## 2013-11-12 DIAGNOSIS — IMO0001 Reserved for inherently not codable concepts without codable children: Secondary | ICD-10-CM | POA: Diagnosis not present

## 2013-11-17 ENCOUNTER — Ambulatory Visit: Payer: 59 | Admitting: Physical Therapy

## 2013-11-17 DIAGNOSIS — IMO0001 Reserved for inherently not codable concepts without codable children: Secondary | ICD-10-CM | POA: Diagnosis not present

## 2013-11-19 ENCOUNTER — Ambulatory Visit: Payer: 59 | Admitting: *Deleted

## 2013-11-19 DIAGNOSIS — IMO0001 Reserved for inherently not codable concepts without codable children: Secondary | ICD-10-CM | POA: Diagnosis not present

## 2013-11-24 ENCOUNTER — Ambulatory Visit: Payer: 59 | Attending: Orthopaedic Surgery | Admitting: Physical Therapy

## 2013-11-24 DIAGNOSIS — M545 Low back pain, unspecified: Secondary | ICD-10-CM | POA: Diagnosis not present

## 2013-11-24 DIAGNOSIS — IMO0001 Reserved for inherently not codable concepts without codable children: Secondary | ICD-10-CM | POA: Diagnosis present

## 2013-11-26 ENCOUNTER — Ambulatory Visit: Payer: 59 | Admitting: Physical Therapy

## 2013-11-26 DIAGNOSIS — IMO0001 Reserved for inherently not codable concepts without codable children: Secondary | ICD-10-CM | POA: Diagnosis not present

## 2013-12-01 ENCOUNTER — Ambulatory Visit: Payer: 59 | Admitting: Physical Therapy

## 2013-12-01 DIAGNOSIS — IMO0001 Reserved for inherently not codable concepts without codable children: Secondary | ICD-10-CM | POA: Diagnosis not present

## 2013-12-03 ENCOUNTER — Encounter: Payer: Private Health Insurance - Indemnity | Admitting: Physical Therapy

## 2013-12-24 ENCOUNTER — Ambulatory Visit: Payer: 59 | Attending: Orthopaedic Surgery | Admitting: Physical Therapy

## 2013-12-24 DIAGNOSIS — IMO0001 Reserved for inherently not codable concepts without codable children: Secondary | ICD-10-CM | POA: Diagnosis not present

## 2013-12-25 ENCOUNTER — Encounter: Payer: Self-pay | Admitting: Gastroenterology

## 2013-12-29 ENCOUNTER — Ambulatory Visit: Payer: 59 | Admitting: Physical Therapy

## 2013-12-29 DIAGNOSIS — IMO0001 Reserved for inherently not codable concepts without codable children: Secondary | ICD-10-CM | POA: Diagnosis not present

## 2013-12-31 ENCOUNTER — Ambulatory Visit: Payer: 59 | Admitting: *Deleted

## 2013-12-31 DIAGNOSIS — IMO0001 Reserved for inherently not codable concepts without codable children: Secondary | ICD-10-CM | POA: Diagnosis not present

## 2014-01-05 ENCOUNTER — Ambulatory Visit: Payer: 59 | Admitting: Physical Therapy

## 2014-01-05 DIAGNOSIS — IMO0001 Reserved for inherently not codable concepts without codable children: Secondary | ICD-10-CM | POA: Diagnosis not present

## 2014-01-07 ENCOUNTER — Encounter: Payer: Private Health Insurance - Indemnity | Admitting: Physical Therapy

## 2014-01-11 ENCOUNTER — Other Ambulatory Visit: Payer: Self-pay | Admitting: General Practice

## 2014-01-26 ENCOUNTER — Encounter: Payer: Self-pay | Admitting: Family

## 2014-01-26 ENCOUNTER — Ambulatory Visit (INDEPENDENT_AMBULATORY_CARE_PROVIDER_SITE_OTHER): Payer: No Typology Code available for payment source | Admitting: Family

## 2014-01-26 VITALS — BP 135/78 | HR 91 | Temp 97.6°F | Ht 67.0 in | Wt 281.0 lb

## 2014-01-26 DIAGNOSIS — Z1321 Encounter for screening for nutritional disorder: Secondary | ICD-10-CM

## 2014-01-26 DIAGNOSIS — R635 Abnormal weight gain: Secondary | ICD-10-CM

## 2014-01-26 DIAGNOSIS — E039 Hypothyroidism, unspecified: Secondary | ICD-10-CM

## 2014-01-26 DIAGNOSIS — E785 Hyperlipidemia, unspecified: Secondary | ICD-10-CM | POA: Insufficient documentation

## 2014-01-26 DIAGNOSIS — I10 Essential (primary) hypertension: Secondary | ICD-10-CM | POA: Insufficient documentation

## 2014-01-26 LAB — POCT CBC
Granulocyte percent: 70.6 %G (ref 37–80)
HEMATOCRIT: 47.7 % (ref 37.7–47.9)
HEMOGLOBIN: 15.1 g/dL (ref 12.2–16.2)
Lymph, poc: 4.3 — AB (ref 0.6–3.4)
MCH: 26.7 pg — AB (ref 27–31.2)
MCHC: 31.7 g/dL — AB (ref 31.8–35.4)
MCV: 84.2 fL (ref 80–97)
MPV: 7.6 fL (ref 0–99.8)
POC Granulocyte: 11.1 — AB (ref 2–6.9)
POC LYMPH PERCENT: 27.5 %L (ref 10–50)
Platelet Count, POC: 368 10*3/uL (ref 142–424)
RBC: 5.7 M/uL — AB (ref 4.04–5.48)
RDW, POC: 14.4 %
WBC: 15.7 10*3/uL — AB (ref 4.6–10.2)

## 2014-01-26 NOTE — Progress Notes (Signed)
Subjective:    Patient ID: Brianna Kelly, female    DOB: [birth year], 63 y.o.   MRN: [id number]  Hypertension This is a chronic problem. The current episode started more than 1 year ago. The problem has been waxing and waning since onset. The problem is uncontrolled. Pertinent negatives include no anxiety, headaches, palpitations, peripheral edema or shortness of breath. Risk factors for coronary artery disease include dyslipidemia, obesity, post-menopausal state and sedentary lifestyle. Past treatments include ACE inhibitors and diuretics. The current treatment provides mild improvement. Hypertensive end-organ damage includes kidney disease and a thyroid problem. There is no history of CAD/MI or heart failure. There is no history of sleep apnea.  Hyperlipidemia This is a chronic problem. The current episode started more than 1 year ago. The problem is controlled. Recent lipid tests were reviewed and are normal. Exacerbating diseases include hypothyroidism and obesity. She has no history of diabetes. Factors aggravating her hyperlipidemia include fatty foods. Pertinent negatives include no leg pain, myalgias or shortness of breath. Current antihyperlipidemic treatment includes statins. The current treatment provides moderate improvement of lipids. Risk factors for coronary artery disease include dyslipidemia, hypertension, obesity, post-menopausal and a sedentary lifestyle.  Thyroid Problem Presents for follow-up visit. Patient reports no anxiety, constipation, depressed mood, diarrhea, hair loss or palpitations. The symptoms have been stable. Past treatments include levothyroxine. The treatment provided significant relief. Her past medical history is significant for hyperlipidemia. There is no history of diabetes or heart failure.      Review of Systems  Constitutional: Negative.   HENT: Negative.   Eyes: Negative.   Respiratory: Negative.  Negative for shortness of breath.   Cardiovascular:  Negative.  Negative for palpitations.  Gastrointestinal: Negative.  Negative for diarrhea and constipation.  Endocrine: Negative.   Genitourinary: Negative.   Musculoskeletal: Negative.  Negative for myalgias.  Neurological: Negative.  Negative for headaches.  Hematological: Negative.   Psychiatric/Behavioral: Negative.   All other systems reviewed and are negative.      Objective:   Physical Exam  Vitals reviewed. Constitutional: She is oriented to person, place, and time. She appears well-developed and well-nourished. No distress.  HENT:  Head: Normocephalic and atraumatic.  Right Ear: External ear normal.  Mouth/Throat: Oropharynx is clear and moist.  Eyes: Pupils are equal, round, and reactive to light.  Neck: Normal range of motion. Neck supple. No thyromegaly present.  Cardiovascular: Normal rate, regular rhythm, normal heart sounds and intact distal pulses.   No murmur heard. Pulmonary/Chest: Effort normal and breath sounds normal. No respiratory distress. She has no wheezes.  Abdominal: Soft. Bowel sounds are normal. She exhibits no distension. There is no tenderness.  Musculoskeletal: Normal range of motion. She exhibits no edema and no tenderness.  Neurological: She is alert and oriented to person, place, and time. She has normal reflexes. No cranial nerve deficit.  Skin: Skin is warm and dry.  Psychiatric: She has a normal mood and affect. Her behavior is normal. Judgment and thought content normal.      BP 135/78  Pulse 91  Temp(Src) 97.6 F (36.4 C) (Oral)  Ht 5' 7"  (1.702 m)  Wt 281 lb (127.461 kg)  BMI 44.00 kg/m2     Assessment & Plan:  1. Unspecified hypothyroidism  - Thyroid Panel With TSH  2. Essential hypertension, benign - POCT CBC - CMP14+EGFR  3. Hyperlipidemia LDL goal <100 - Lipid panel  4. Encounter for vitamin deficiency screening - Vit D  25 hydroxy (rtn osteoporosis monitoring)  5. Morbid  obesity -Pt seeing surgeron for bypass  surgery   Continue all meds Labs pending Health Maintenance reviewed Diet and exercise encouraged RTO 4 months  Evelina Dun, FNP

## 2014-01-26 NOTE — Patient Instructions (Signed)
Health Maintenance, Female A healthy lifestyle and preventative care can promote health and wellness.  Maintain regular health, dental, and eye exams.  Eat a healthy diet. Foods like vegetables, fruits, whole grains, low-fat dairy products, and lean protein foods contain the nutrients you need without too many calories. Decrease your intake of foods high in solid fats, added sugars, and salt. Get information about a proper diet from your caregiver, if necessary.  Regular physical exercise is one of the most important things you can do for your health. Most adults should get at least 150 minutes of moderate-intensity exercise (any activity that increases your heart rate and causes you to sweat) each week. In addition, most adults need muscle-strengthening exercises on 2 or more days a week.   Maintain a healthy weight. The body mass index (BMI) is a screening tool to identify possible weight problems. It provides an estimate of body fat based on height and weight. Your caregiver can help determine your BMI, and can help you achieve or maintain a healthy weight. For adults 20 years and older:  A BMI below 18.5 is considered underweight.  A BMI of 18.5 to 24.9 is normal.  A BMI of 25 to 29.9 is considered overweight.  A BMI of 30 and above is considered obese.  Maintain normal blood lipids and cholesterol by exercising and minimizing your intake of saturated fat. Eat a balanced diet with plenty of fruits and vegetables. Blood tests for lipids and cholesterol should begin at age 41 and be repeated every 5 years. If your lipid or cholesterol levels are high, you are over 50, or you are a high risk for heart disease, you may need your cholesterol levels checked more frequently.Ongoing high lipid and cholesterol levels should be treated with medicines if diet and exercise are not effective.  If you smoke, find out from your caregiver how to quit. If you do not use tobacco, do not start.  Lung  cancer screening is recommended for adults aged 66-80 years who are at high risk for developing lung cancer because of a history of smoking. Yearly low-dose computed tomography (CT) is recommended for people who have at least a 30-pack-year history of smoking and are a current smoker or have quit within the past 15 years. A pack year of smoking is smoking an average of 1 pack of cigarettes a day for 1 year (for example: 1 pack a day for 30 years or 2 packs a day for 15 years). Yearly screening should continue until the smoker has stopped smoking for at least 15 years. Yearly screening should also be stopped for people who develop a health problem that would prevent them from having lung cancer treatment.  If you are pregnant, do not drink alcohol. If you are breastfeeding, be very cautious about drinking alcohol. If you are not pregnant and choose to drink alcohol, do not exceed 1 drink per day. One drink is considered to be 12 ounces (355 mL) of beer, 5 ounces (148 mL) of wine, or 1.5 ounces (44 mL) of liquor.  Avoid use of street drugs. Do not share needles with anyone. Ask for help if you need support or instructions about stopping the use of drugs.  High blood pressure causes heart disease and increases the risk of stroke. Blood pressure should be checked at least every 1 to 2 years. Ongoing high blood pressure should be treated with medicines, if weight loss and exercise are not effective.  If you are 55 to 62  years old, ask your caregiver if you should take aspirin to prevent strokes.  Diabetes screening involves taking a blood sample to check your fasting blood sugar level. This should be done once every 3 years, after age 45, if you are within normal weight and without risk factors for diabetes. Testing should be considered at a younger age or be carried out more frequently if you are overweight and have at least 1 risk factor for diabetes.  Breast cancer screening is essential preventative care  for women. You should practice "breast self-awareness." This means understanding the normal appearance and feel of your breasts and may include breast self-examination. Any changes detected, no matter how small, should be reported to a caregiver. Women in their 20s and 30s should have a clinical breast exam (CBE) by a caregiver as part of a regular health exam every 1 to 3 years. After age 40, women should have a CBE every year. Starting at age 40, women should consider having a mammogram (breast X-ray) every year. Women who have a family history of breast cancer should talk to their caregiver about genetic screening. Women at a high risk of breast cancer should talk to their caregiver about having an MRI and a mammogram every year.  Breast cancer gene (BRCA)-related cancer risk assessment is recommended for women who have family members with BRCA-related cancers. BRCA-related cancers include breast, ovarian, tubal, and peritoneal cancers. Having family members with these cancers may be associated with an increased risk for harmful changes (mutations) in the breast cancer genes BRCA1 and BRCA2. Results of the assessment will determine the need for genetic counseling and BRCA1 and BRCA2 testing.  The Pap test is a screening test for cervical cancer. Women should have a Pap test starting at age 21. Between ages 21 and 29, Pap tests should be repeated every 2 years. Beginning at age 30, you should have a Pap test every 3 years as long as the past 3 Pap tests have been normal. If you had a hysterectomy for a problem that was not cancer or a condition that could lead to cancer, then you no longer need Pap tests. If you are between ages 65 and 70, and you have had normal Pap tests going back 10 years, you no longer need Pap tests. If you have had past treatment for cervical cancer or a condition that could lead to cancer, you need Pap tests and screening for cancer for at least 20 years after your treatment. If Pap  tests have been discontinued, risk factors (such as a new sexual partner) need to be reassessed to determine if screening should be resumed. Some women have medical problems that increase the chance of getting cervical cancer. In these cases, your caregiver may recommend more frequent screening and Pap tests.  The human papillomavirus (HPV) test is an additional test that may be used for cervical cancer screening. The HPV test looks for the virus that can cause the cell changes on the cervix. The cells collected during the Pap test can be tested for HPV. The HPV test could be used to screen women aged 30 years and older, and should be used in women of any age who have unclear Pap test results. After the age of 30, women should have HPV testing at the same frequency as a Pap test.  Colorectal cancer can be detected and often prevented. Most routine colorectal cancer screening begins at the age of 50 and continues through age 75. However, your caregiver may   recommend screening at an earlier age if you have risk factors for colon cancer. On a yearly basis, your caregiver may provide home test kits to check for hidden blood in the stool. Use of a small camera at the end of a tube, to directly examine the colon (sigmoidoscopy or colonoscopy), can detect the earliest forms of colorectal cancer. Talk to your caregiver about this at age 70, when routine screening begins. Direct examination of the colon should be repeated every 5 to 10 years through age 28, unless early forms of pre-cancerous polyps or small growths are found.  Hepatitis C blood testing is recommended for all people born from [birth year] through 1964 and any individual with known risks for hepatitis C.  Practice safe sex. Use condoms and avoid high-risk sexual practices to reduce the spread of sexually transmitted infections (STIs). Sexually active women aged 18 and younger should be checked for Chlamydia, which is a common sexually transmitted infection.  Older women with new or multiple partners should also be tested for Chlamydia. Testing for other STIs is recommended if you are sexually active and at increased risk.  Osteoporosis is a disease in which the bones lose minerals and strength with aging. This can result in serious bone fractures. The risk of osteoporosis can be identified using a bone density scan. Women ages 3 and over and women at risk for fractures or osteoporosis should discuss screening with their caregivers. Ask your caregiver whether you should be taking a calcium supplement or vitamin D to reduce the rate of osteoporosis.  Menopause can be associated with physical symptoms and risks. Hormone replacement therapy is available to decrease symptoms and risks. You should talk to your caregiver about whether hormone replacement therapy is right for you.  Use sunscreen. Apply sunscreen liberally and repeatedly throughout the day. You should seek shade when your shadow is shorter than you. Protect yourself by wearing long sleeves, pants, a wide-brimmed hat, and sunglasses year round, whenever you are outdoors.  Notify your caregiver of new moles or changes in moles, especially if there is a change in shape or color. Also notify your caregiver if a mole is larger than the size of a pencil eraser.  Stay current with your immunizations. Document Released: 01/22/2011 Document Revised: 11/03/2012 Document Reviewed: 06/10/2013 [facility] Patient Information 2015 [place], [place]. This information is not intended to replace advice given to you by your health care provider. Make sure you discuss any questions you have with your health care provider.  Exercise to Lose Weight Exercise and a healthy diet may help you lose weight. Your doctor may suggest specific exercises. EXERCISE IDEAS AND TIPS  Choose low-cost things you enjoy doing, such as walking, bicycling, or exercising to workout videos.  Take stairs instead of the elevator.  Walk  during your lunch break.  Park your car further away from work or school.  Go to a gym or an exercise class.  Start with 5 to 10 minutes of exercise each day. Build up to 30 minutes of exercise 4 to 6 days a week.  Wear shoes with good support and comfortable clothes.  Stretch before and after working out.  Work out until you breathe harder and your heart beats faster.  Drink extra water when you exercise.  Do not do so much that you hurt yourself, feel dizzy, or get very short of breath. Exercises that burn about 150 calories:  Running 1  miles in 15 minutes.  Playing volleyball for 45 to 60 minutes.  Washing and waxing a car for 45 to 60 minutes.  Playing touch football for 45 minutes.  Walking 1  miles in 35 minutes.  Pushing a stroller 1  miles in 30 minutes.  Playing basketball for 30 minutes.  Raking leaves for 30 minutes.  Bicycling 5 miles in 30 minutes.  Walking 2 miles in 30 minutes.  Dancing for 30 minutes.  Shoveling snow for 15 minutes.  Swimming laps for 20 minutes.  Walking up stairs for 15 minutes.  Bicycling 4 miles in 15 minutes.  Gardening for 30 to 45 minutes.  Jumping rope for 15 minutes.  Washing windows or floors for 45 to 60 minutes. Document Released: 08/11/2010 Document Revised: 10/01/2011 Document Reviewed: 08/11/2010 [facility] Patient Information 2015 [place], [place]. This information is not intended to replace advice given to you by your health care provider. Make sure you discuss any questions you have with your health care provider.

## 2014-01-27 ENCOUNTER — Telehealth: Payer: Self-pay | Admitting: Family Medicine

## 2014-01-27 LAB — THYROID PANEL WITH TSH
FREE THYROXINE INDEX: 2.2 (ref 1.2–4.9)
T3 Uptake Ratio: 25 % (ref 24–39)
T4, Total: 8.9 ug/dL (ref 4.5–12.0)
TSH: 4.12 u[IU]/mL (ref 0.450–4.500)

## 2014-01-27 LAB — CMP14+EGFR
A/G RATIO: 1.5 (ref 1.1–2.5)
ALT: 40 IU/L — AB (ref 0–32)
AST: 26 IU/L (ref 0–40)
Albumin: 4.1 g/dL (ref 3.6–4.8)
Alkaline Phosphatase: 122 IU/L — ABNORMAL HIGH (ref 39–117)
BUN/Creatinine Ratio: 37 — ABNORMAL HIGH (ref 11–26)
BUN: 36 mg/dL — ABNORMAL HIGH (ref 8–27)
CO2: 23 mmol/L (ref 18–29)
Calcium: 9.2 mg/dL (ref 8.7–10.3)
Chloride: 95 mmol/L — ABNORMAL LOW (ref 97–108)
Creatinine, Ser: 0.97 mg/dL (ref 0.57–1.00)
GFR, EST AFRICAN AMERICAN: 73 mL/min/{1.73_m2} (ref >59)
GFR, EST NON AFRICAN AMERICAN: 63 mL/min/{1.73_m2} (ref >59)
Globulin, Total: 2.8 g/dL (ref 1.5–4.5)
Glucose: 92 mg/dL (ref 65–99)
POTASSIUM: 4.2 mmol/L (ref 3.5–5.2)
SODIUM: 137 mmol/L (ref 134–144)
Total Bilirubin: 0.8 mg/dL (ref 0.0–1.2)
Total Protein: 6.9 g/dL (ref 6.0–8.5)

## 2014-01-27 LAB — LIPID PANEL
CHOL/HDL RATIO: 2.4 ratio (ref 0.0–4.4)
Cholesterol, Total: 119 mg/dL (ref 100–199)
HDL: 49 mg/dL (ref >39)
LDL CALC: 51 mg/dL (ref 0–99)
TRIGLYCERIDES: 95 mg/dL (ref 0–149)
VLDL Cholesterol Cal: 19 mg/dL (ref 5–40)

## 2014-01-27 LAB — VITAMIN D 25 HYDROXY (VIT D DEFICIENCY, FRACTURES): Vit D, 25-Hydroxy: 38.9 ng/mL (ref 30.0–100.0)

## 2014-01-27 NOTE — Telephone Encounter (Signed)
Message copied by Waverly Ferrari on Wed Jan 27, 2014 12:52 PM ------      Message from: Lenna Gilford, [place] A      Created: Wed Jan 27, 2014 12:43 PM       WBC elevated- If you start having any urinary problems may need to have urinalysis done      Hgb and Plts WNL      Kidney and liver function stable      Thyroid levels WNL      Vit D levels on low side of normal- Would benefit from Vit D OTC      Cholesterol WNL ------

## 2014-01-28 NOTE — Telephone Encounter (Signed)
Patient aware.

## 2014-02-03 ENCOUNTER — Encounter: Payer: Self-pay | Admitting: Family

## 2014-02-05 ENCOUNTER — Ambulatory Visit (AMBULATORY_SURGERY_CENTER): Payer: Self-pay | Admitting: *Deleted

## 2014-02-05 VITALS — Ht 66.5 in | Wt 280.2 lb

## 2014-02-05 DIAGNOSIS — Z1211 Encounter for screening for malignant neoplasm of colon: Secondary | ICD-10-CM

## 2014-02-05 MED ORDER — MOVIPREP 100 G PO SOLR: ORAL | Status: DC

## 2014-02-05 NOTE — Progress Notes (Signed)
No allergies to eggs or soy. No problems with anesthesia.  Pt given Emmi instructions for colonoscopy  No oxygen use  No diet drug use  

## 2014-02-19 ENCOUNTER — Ambulatory Visit (AMBULATORY_SURGERY_CENTER): Payer: 59 | Admitting: Gastroenterology

## 2014-02-19 ENCOUNTER — Encounter: Payer: Self-pay | Admitting: Gastroenterology

## 2014-02-19 VITALS — BP 131/55 | HR 63 | Temp 97.5°F | Resp 16 | Ht 66.0 in | Wt 280.0 lb

## 2014-02-19 DIAGNOSIS — D126 Benign neoplasm of colon, unspecified: Secondary | ICD-10-CM

## 2014-02-19 DIAGNOSIS — K5730 Diverticulosis of large intestine without perforation or abscess without bleeding: Secondary | ICD-10-CM

## 2014-02-19 DIAGNOSIS — D123 Benign neoplasm of transverse colon: Secondary | ICD-10-CM

## 2014-02-19 DIAGNOSIS — Z1211 Encounter for screening for malignant neoplasm of colon: Secondary | ICD-10-CM

## 2014-02-19 MED ORDER — SODIUM CHLORIDE 0.9 % IV SOLN: 500.0000 mL | INTRAVENOUS | Status: DC

## 2014-02-19 NOTE — Progress Notes (Signed)
Called to room to assist during endoscopic procedure.  Patient ID and intended procedure confirmed with present staff. Received instructions for my participation in the procedure from the performing physician.  

## 2014-02-19 NOTE — Progress Notes (Signed)
Procedure ends, to recovery, report given and VSS. 

## 2014-02-19 NOTE — Op Note (Signed)
[place]  [address] [place], [address]   COLONOSCOPY PROCEDURE REPORT  PATIENT: Brianna Kelly, Brianna Kelly  MR#: [id number] BIRTHDATE: 1950/08/05 , 51  yrs. old GENDER: Female ENDOSCOPIST: Milus Banister, MD REFERRED CW:[id number] Laurance Flatten, M.D. PROCEDURE DATE:  02/19/2014 PROCEDURE:   Colonoscopy with snare polypectomy First Screening Colonoscopy - Avg.  risk and is 50 yrs.  old or older Yes.  Prior Negative Screening - Now for repeat screening. N/A  History of Adenoma - Now for follow-up colonoscopy & has been > or = to 3 yrs.  N/A  Polyps Removed Today? Yes. ASA CLASS:   Class II INDICATIONS:average risk screening. MEDICATIONS: MAC sedation, administered by CRNA and Propofol (Diprivan) 440 mg IV  DESCRIPTION OF PROCEDURE:   After the risks benefits and alternatives of the procedure were thoroughly explained, informed consent was obtained.  A digital rectal exam revealed no abnormalities of the rectum.   The LB CF-H180AL Loaner [id number] endoscope was introduced through the anus and advanced to the cecum, which was identified by both the appendix and ileocecal valve. No adverse events experienced.   The quality of the prep was excellent.  The instrument was then slowly withdrawn as the colon was fully examined.   COLON FINDINGS: Two polyps were found, removed and sent to pathology.  These were both sessile, located in transverse segment, removed with cold snare, 2-69mm across.  There were multiple diverticulum in the left colon.  The examination was otherwise normal.  Retroflexed views revealed no abnormalities. The time to cecum=5 minutes 14 seconds.  Withdrawal time=13 minutes 38 seconds. The scope was withdrawn and the procedure completed. COMPLICATIONS: There were no complications.  ENDOSCOPIC IMPRESSION: Two polyps were found, removed and sent to pathology. There were multiple diverticulum in the left colon.  The examination was otherwise  normal.  RECOMMENDATIONS: If the polyp(s) removed today are proven to be adenomatous (pre-cancerous) polyps, you will need a repeat colonoscopy in 5 years.  Otherwise you should continue to follow colorectal cancer screening guidelines for "routine risk" patients with colonoscopy in 10 years.  You will receive a letter within 1-2 weeks with the results of your biopsy as well as final recommendations.  Please call my office if you have not received a letter after 3 weeks.   eSigned:  Milus Banister, MD 02/19/2014 9:36 AM

## 2014-02-19 NOTE — Patient Instructions (Signed)
YOU HAD AN ENDOSCOPIC PROCEDURE TODAY AT THE [facility]: Refer to the procedure report that was given to you for any specific questions about what was found during the examination.  If the procedure report does not answer your questions, please call your gastroenterologist to clarify.  If you requested that your care partner not be given the details of your procedure findings, then the procedure report has been included in a sealed envelope for you to review at your convenience later.  YOU SHOULD EXPECT: Some feelings of bloating in the abdomen. Passage of more gas than usual.  Walking can help get rid of the air that was put into your GI tract during the procedure and reduce the bloating. If you had a lower endoscopy (such as a colonoscopy or flexible sigmoidoscopy) you may notice spotting of blood in your stool or on the toilet paper. If you underwent a bowel prep for your procedure, then you may not have a normal bowel movement for a few days.  DIET: Your first meal following the procedure should be a light meal and then it is ok to progress to your normal diet.  A half-sandwich or bowl of soup is an example of a good first meal.  Heavy or fried foods are harder to digest and may make you feel nauseous or bloated.  Likewise meals heavy in dairy and vegetables can cause extra gas to form and this can also increase the bloating.  Drink plenty of fluids but you should avoid alcoholic beverages for 24 hours.  ACTIVITY: Your care partner should take you home directly after the procedure.  You should plan to take it easy, moving slowly for the rest of the day.  You can resume normal activity the day after the procedure however you should NOT DRIVE or use heavy machinery for 24 hours (because of the sedation medicines used during the test).    SYMPTOMS TO REPORT IMMEDIATELY: A gastroenterologist can be reached at any hour.  During normal business hours, 8:30 AM to 5:00 PM Monday through Friday,  call [phone].  After hours and on weekends, please call the GI answering service at [phone] who will take a message and have the physician on call contact you.   Following lower endoscopy (colonoscopy or flexible sigmoidoscopy):  Excessive amounts of blood in the stool  Significant tenderness or worsening of abdominal pains  Swelling of the abdomen that is new, acute  Fever of 100F or higher    FOLLOW UP: If any biopsies were taken you will be contacted by phone or by letter within the next 1-3 weeks.  Call your gastroenterologist if you have not heard about the biopsies in 3 weeks.  Our staff will call the home number listed on your records the next business day following your procedure to check on you and address any questions or concerns that you may have at that time regarding the information given to you following your procedure. This is a courtesy call and so if there is no answer at the home number and we have not heard from you through the emergency physician on call, we will assume that you have returned to your regular daily activities without incident.  SIGNATURES/CONFIDENTIALITY: You and/or your care partner have signed paperwork which will be entered into your electronic medical record.  These signatures attest to the fact that that the information above on your After Visit Summary has been reviewed and is understood.  Full responsibility of the confidentiality   this discharge information lies with you and/or your care-partner.  Polyp, diverticulosis and high fiber diet information given. 

## 2014-02-22 ENCOUNTER — Telehealth: Payer: Self-pay | Admitting: *Deleted

## 2014-02-22 NOTE — Telephone Encounter (Signed)
  Follow up Call-  Call back number 02/19/2014  Post procedure Call Back phone  # [phone]  husband  Permission to leave phone message Yes    Spoke with husband-she was at work Patient questions:  Do you have a fever, pain , or abdominal swelling? No. Pain Score  0 *  Have you tolerated food without any problems? Yes.    Have you been able to return to your normal activities? Yes.    Do you have any questions about your discharge instructions: Diet   No. Medications  No. Follow up visit  No.  Do you have questions or concerns about your Care? No.  Actions: * If pain score is 4 or above: No action needed, pain <4.

## 2014-02-26 ENCOUNTER — Encounter: Payer: Self-pay | Admitting: Gastroenterology

## 2014-03-04 ENCOUNTER — Encounter: Payer: Self-pay | Admitting: *Deleted

## 2014-03-26 ENCOUNTER — Encounter: Payer: Self-pay | Admitting: Nurse Practitioner

## 2014-03-26 ENCOUNTER — Ambulatory Visit (INDEPENDENT_AMBULATORY_CARE_PROVIDER_SITE_OTHER): Payer: No Typology Code available for payment source | Admitting: Nurse Practitioner

## 2014-03-26 ENCOUNTER — Ambulatory Visit (INDEPENDENT_AMBULATORY_CARE_PROVIDER_SITE_OTHER): Payer: No Typology Code available for payment source

## 2014-03-26 VITALS — BP 143/94 | HR 84 | Temp 97.0°F | Ht 67.0 in | Wt 273.0 lb

## 2014-03-26 DIAGNOSIS — R350 Frequency of micturition: Secondary | ICD-10-CM

## 2014-03-26 DIAGNOSIS — R3129 Other microscopic hematuria: Secondary | ICD-10-CM

## 2014-03-26 DIAGNOSIS — R109 Unspecified abdominal pain: Secondary | ICD-10-CM

## 2014-03-26 LAB — POCT URINALYSIS DIPSTICK
BILIRUBIN UA: NEGATIVE
GLUCOSE UA: NEGATIVE
Ketones, UA: NEGATIVE
Leukocytes, UA: NEGATIVE
NITRITE UA: NEGATIVE
Spec Grav, UA: 1.01
UROBILINOGEN UA: NEGATIVE
pH, UA: 7.5

## 2014-03-26 LAB — POCT UA - MICROSCOPIC ONLY
BACTERIA, U MICROSCOPIC: NEGATIVE
Casts, Ur, LPF, POC: NEGATIVE
Crystals, Ur, HPF, POC: NEGATIVE
Mucus, UA: NEGATIVE
Yeast, UA: NEGATIVE

## 2014-03-26 MED ORDER — HYDROCODONE-ACETAMINOPHEN 5-325 MG PO TABS: 1.0000 | ORAL_TABLET | Freq: Four times a day (QID) | ORAL | Status: DC | PRN

## 2014-03-26 NOTE — Patient Instructions (Signed)
Kidney Stones Kidney stones (urolithiasis) are deposits that form inside your kidneys. The intense pain is caused by the stone moving through the urinary tract. When the stone moves, the ureter goes into spasm around the stone. The stone is usually passed in the urine.  CAUSES   A disorder that makes certain neck glands produce too much parathyroid hormone (primary hyperparathyroidism).  A buildup of uric acid crystals, similar to gout in your joints.  Narrowing (stricture) of the ureter.  A kidney obstruction present at birth (congenital obstruction).  Previous surgery on the kidney or ureters.  Numerous kidney infections. SYMPTOMS   Feeling sick to your stomach (nauseous).  Throwing up (vomiting).  Blood in the urine (hematuria).  Pain that usually spreads (radiates) to the groin.  Frequency or urgency of urination. DIAGNOSIS   Taking a history and physical exam.  Blood or urine tests.  CT scan.  Occasionally, an examination of the inside of the urinary bladder (cystoscopy) is performed. TREATMENT   Observation.  Increasing your fluid intake.  Extracorporeal shock wave lithotripsy--This is a noninvasive procedure that uses shock waves to break up kidney stones.  Surgery may be needed if you have severe pain or persistent obstruction. There are various surgical procedures. Most of the procedures are performed with the use of small instruments. Only small incisions are needed to accommodate these instruments, so recovery time is minimized. The size, location, and chemical composition are all important variables that will determine the proper choice of action for you. Talk to your health care provider to better understand your situation so that you will minimize the risk of injury to yourself and your kidney.  HOME CARE INSTRUCTIONS   Drink enough water and fluids to keep your urine clear or pale yellow. This will help you to pass the stone or stone fragments.  Strain  all urine through the provided strainer. Keep all particulate matter and stones for your health care provider to see. The stone causing the pain may be as small as a grain of salt. It is very important to use the strainer each and every time you pass your urine. The collection of your stone will allow your health care provider to analyze it and verify that a stone has actually passed. The stone analysis will often identify what you can do to reduce the incidence of recurrences.  Only take over-the-counter or prescription medicines for pain, discomfort, or fever as directed by your health care provider.  Make a follow-up appointment with your health care provider as directed.  Get follow-up X-rays if required. The absence of pain does not always mean that the stone has passed. It may have only stopped moving. If the urine remains completely obstructed, it can cause loss of kidney function or even complete destruction of the kidney. It is your responsibility to make sure X-rays and follow-ups are completed. Ultrasounds of the kidney can show blockages and the status of the kidney. Ultrasounds are not associated with any radiation and can be performed easily in a matter of minutes. SEEK MEDICAL CARE IF:  You experience pain that is progressive and unresponsive to any pain medicine you have been prescribed. SEEK IMMEDIATE MEDICAL CARE IF:   Pain cannot be controlled with the prescribed medicine.  You have a fever or shaking chills.  The severity or intensity of pain increases over 18 hours and is not relieved by pain medicine.  You develop a new onset of abdominal pain.  You feel faint or pass out.  You are unable to urinate. °MAKE SURE YOU:  °· Understand these instructions. °· Will watch your condition. °· Will get help right away if you are not doing well or get worse. °Document Released: 07/09/2005 Document Revised: 03/11/2013 Document Reviewed: 12/10/2012 °Exit[employer]® Patient Information ©2015  [employer]. This information is not intended to replace advice given to you by your health care provider. Make sure you discuss any questions you have with your health care provider. °Hematuria °Hematuria is blood in your urine. It can be caused by a bladder infection, kidney infection, prostate infection, kidney stone, or cancer of your urinary tract. Infections can usually be treated with medicine, and a kidney stone usually will pass through your urine. If neither of these is the cause of your hematuria, further workup to find out the reason may be needed. °It is very important that you tell your health care provider about any blood you see in your urine, even if the blood stops without treatment or happens without causing pain. Blood in your urine that happens and then stops and then happens again can be a symptom of a very serious condition. Also, pain is not a symptom in the initial stages of many urinary cancers. °HOME CARE INSTRUCTIONS  °· Drink lots of fluid, 3-4 quarts a day. If you have been diagnosed with an infection, cranberry juice is especially recommended, in addition to large amounts of water. °· Avoid caffeine, tea, and carbonated beverages because they tend to irritate the bladder. °· Avoid alcohol because it may irritate the prostate. °· Take all medicines as directed by your health care provider. °· If you were prescribed an antibiotic medicine, finish it all even if you start to feel better. °· If you have been diagnosed with a kidney stone, follow your health care provider's instructions regarding straining your urine to catch the stone. °· Empty your bladder often. Avoid holding urine for long periods of time. °· After a bowel movement, women should cleanse front to back. Use each tissue only once. °· Empty your bladder before and after sexual intercourse if you are a female. °SEEK MEDICAL CARE IF: °· You develop back pain. °· You have a fever. °· You have a feeling of sickness in your  stomach (nausea) or vomiting. °· Your symptoms are not better in 3 days. Return sooner if you are getting worse. °SEEK IMMEDIATE MEDICAL CARE IF:  °· You develop severe vomiting and are unable to keep the medicine down. °· You develop severe back or abdominal pain despite taking your medicines. °· You begin passing a large amount of blood or clots in your urine. °· You feel extremely weak or faint, or you pass out. °MAKE SURE YOU:  °· Understand these instructions. °· Will watch your condition. °· Will get help right away if you are not doing well or get worse. °Document Released: 07/09/2005 Document Revised: 11/23/2013 Document Reviewed: 03/09/2013 °[employer]® Patient Information ©2015 [employer]. This information is not intended to replace advice given to you by your health care provider. Make sure you discuss any questions you have with your health care provider. ° °

## 2014-03-26 NOTE — Progress Notes (Signed)
   Subjective:    Patient ID: Jasslyn Finkel, female    DOB: 03/21/1951, 63 y.o.   MRN: [id number]  HPI Patient in c/o urinary urgency and frequency- Pain along back at bra line.    Review of Systems  Constitutional: Negative for fever and chills.  HENT: Negative.   Respiratory: Negative.   Cardiovascular: Negative.   Gastrointestinal: Negative.   Genitourinary: Positive for urgency and frequency.  Neurological: Negative.   Psychiatric/Behavioral: Negative.   All other systems reviewed and are negative.      Objective:   Physical Exam  Constitutional: She is oriented to person, place, and time. She appears well-developed and well-nourished.  Cardiovascular: Normal rate and normal heart sounds.   Pulmonary/Chest: Effort normal and breath sounds normal.  Abdominal: Soft. Bowel sounds are normal.  Genitourinary:  Mild left CVA tenderness  Neurological: She is alert and oriented to person, place, and time.  Skin: Skin is warm and dry.  Psychiatric: She has a normal mood and affect. Her behavior is normal. Judgment and thought content normal.  BP 143/94  Pulse 84  Temp(Src) 97 F (36.1 C) (Oral)  Ht 5\' 7"  (1.702 m)  Wt 273 lb (123.832 kg)  BMI 42.75 kg/m2   KUB- moderate amount of stool burden in colon- no left calculi visible-Preliminary reading by Ronnald Collum, FNP  [facility]  Results for orders placed in visit on 03/26/14  POCT URINALYSIS DIPSTICK      Result Value Ref Range   Color, UA straw     Clarity, UA cloudy     Glucose, UA neg     Bilirubin, UA neg     Ketones, UA neg     Spec Grav, UA 1.010     Blood, UA large     pH, UA 7.5     Protein, UA 3+     Urobilinogen, UA negative     Nitrite, UA neg     Leukocytes, UA Negative    POCT UA - MICROSCOPIC ONLY      Result Value Ref Range   WBC, Ur, HPF, POC rare     RBC, urine, microscopic 15-20     Bacteria, U Microscopic neg     Mucus, UA neg     Epithelial cells, urine per micros occ     Crystals, Ur, HPF, POC  neg     Casts, Ur, LPF, POC neg     Yeast, UA neg          Assessment & Plan:   1. Left flank pain   2. Urinary frequency   3. Hematuria, microscopic    Possible kidney stone with symptoms Force fluids- cranberry juice  rest watch for increasing blood in urine  Mary-Margaret Hassell Done, FNP

## 2014-05-27 ENCOUNTER — Telehealth: Payer: Self-pay | Admitting: Nurse Practitioner

## 2014-05-27 MED ORDER — BENAZEPRIL HCL 5 MG PO TABS: 5.0000 mg | ORAL_TABLET | Freq: Every day | ORAL | Status: DC

## 2014-05-27 NOTE — Telephone Encounter (Signed)
done

## 2014-05-31 ENCOUNTER — Ambulatory Visit: Payer: No Typology Code available for payment source | Admitting: Family

## 2014-05-31 ENCOUNTER — Ambulatory Visit: Payer: No Typology Code available for payment source | Admitting: Nurse Practitioner

## 2014-06-10 DIAGNOSIS — M170 Bilateral primary osteoarthritis of knee: Secondary | ICD-10-CM | POA: Insufficient documentation

## 2014-06-21 ENCOUNTER — Other Ambulatory Visit: Payer: Self-pay | Admitting: General Practice

## 2014-06-30 ENCOUNTER — Encounter: Payer: Self-pay | Admitting: *Deleted

## 2014-07-20 ENCOUNTER — Other Ambulatory Visit: Payer: Self-pay | Admitting: General Practice

## 2014-07-29 DIAGNOSIS — Z9889 Other specified postprocedural states: Secondary | ICD-10-CM | POA: Insufficient documentation

## 2014-08-04 ENCOUNTER — Ambulatory Visit: Payer: No Typology Code available for payment source | Admitting: Nurse Practitioner

## 2014-08-05 ENCOUNTER — Ambulatory Visit: Payer: No Typology Code available for payment source | Admitting: Nurse Practitioner

## 2014-08-10 ENCOUNTER — Other Ambulatory Visit: Payer: Self-pay | Admitting: Family Medicine

## 2014-08-24 ENCOUNTER — Encounter: Payer: Self-pay | Admitting: Family

## 2014-08-24 ENCOUNTER — Ambulatory Visit (INDEPENDENT_AMBULATORY_CARE_PROVIDER_SITE_OTHER): Payer: No Typology Code available for payment source | Admitting: Family

## 2014-08-24 VITALS — BP 128/71 | HR 79 | Temp 96.9°F | Ht 67.0 in | Wt 271.4 lb

## 2014-08-24 DIAGNOSIS — M1990 Unspecified osteoarthritis, unspecified site: Secondary | ICD-10-CM

## 2014-08-24 DIAGNOSIS — R109 Unspecified abdominal pain: Secondary | ICD-10-CM | POA: Diagnosis not present

## 2014-08-24 DIAGNOSIS — E785 Hyperlipidemia, unspecified: Secondary | ICD-10-CM

## 2014-08-24 DIAGNOSIS — E039 Hypothyroidism, unspecified: Secondary | ICD-10-CM

## 2014-08-24 DIAGNOSIS — I10 Essential (primary) hypertension: Secondary | ICD-10-CM

## 2014-08-24 DIAGNOSIS — Z1321 Encounter for screening for nutritional disorder: Secondary | ICD-10-CM

## 2014-08-24 MED ORDER — HYDROCODONE-ACETAMINOPHEN 5-325 MG PO TABS: 1.0000 | ORAL_TABLET | Freq: Four times a day (QID) | ORAL | Status: DC | PRN

## 2014-08-24 NOTE — Progress Notes (Signed)
Subjective:    Patient ID: Brianna Kelly, female    DOB: 05-01-1951, 64 y.o.   MRN: [id number]  Hypertension This is a chronic problem. The current episode started more than 1 year ago. The problem has been resolved since onset. The problem is controlled. Pertinent negatives include no anxiety, headaches, palpitations, peripheral edema or shortness of breath. Risk factors for coronary artery disease include dyslipidemia, obesity, post-menopausal state and sedentary lifestyle. Past treatments include ACE inhibitors and diuretics. The current treatment provides mild improvement. Hypertensive end-organ damage includes kidney disease and a thyroid problem. There is no history of CAD/MI, CVA or heart failure. There is no history of sleep apnea.  Hyperlipidemia This is a chronic problem. The current episode started more than 1 year ago. The problem is controlled. Recent lipid tests were reviewed and are normal. Exacerbating diseases include hypothyroidism and obesity. She has no history of diabetes. Factors aggravating her hyperlipidemia include fatty foods. Pertinent negatives include no leg pain, myalgias or shortness of breath. Current antihyperlipidemic treatment includes statins. The current treatment provides moderate improvement of lipids. Risk factors for coronary artery disease include dyslipidemia, hypertension, obesity, post-menopausal and a sedentary lifestyle.  Thyroid Problem Presents for follow-up visit. Symptoms include weight gain. Patient reports no anxiety, depressed mood, dry skin, hair loss, hoarse voice, leg swelling or palpitations. The symptoms have been stable. Past treatments include levothyroxine. The treatment provided significant relief. Her past medical history is significant for hyperlipidemia. There is no history of diabetes or heart failure.      Review of Systems  Constitutional: Positive for weight gain.  HENT: Negative.  Negative for hoarse voice.   Eyes: Negative.     Respiratory: Negative.  Negative for shortness of breath.   Cardiovascular: Negative.  Negative for palpitations.  Gastrointestinal: Negative.   Endocrine: Negative.   Genitourinary: Negative.   Musculoskeletal: Negative.  Negative for myalgias.  Neurological: Negative.  Negative for headaches.  Hematological: Negative.   Psychiatric/Behavioral: Negative.  The patient is not nervous/anxious.   All other systems reviewed and are negative.      Objective:   Physical Exam  Constitutional: She is oriented to person, place, and time. She appears well-developed and well-nourished. No distress.  HENT:  Head: Normocephalic and atraumatic.  Right Ear: External ear normal.  Left Ear: External ear normal.  Nose: Nose normal.  Mouth/Throat: Oropharynx is clear and moist.  Eyes: Pupils are equal, round, and reactive to light.  Neck: Normal range of motion. Neck supple. No thyromegaly present.  Cardiovascular: Normal rate, regular rhythm, normal heart sounds and intact distal pulses.   No murmur heard. Pulmonary/Chest: Effort normal and breath sounds normal. No respiratory distress. She has no wheezes.  Abdominal: Soft. Bowel sounds are normal. She exhibits no distension. There is no tenderness.  Musculoskeletal: Normal range of motion. She exhibits no edema or tenderness.  Neurological: She is alert and oriented to person, place, and time. She has normal reflexes. No cranial nerve deficit.  Skin: Skin is warm and dry.  Psychiatric: She has a normal mood and affect. Her behavior is normal. Judgment and thought content normal.  Vitals reviewed.   BP 128/71 mmHg  Pulse 79  Temp(Src) 96.9 F (36.1 C) (Oral)  Ht _0  (1.702 m)  Wt 271 lb 6.4 oz (123.106 kg)  BMI 42.50 kg/m2       Assessment & Plan:  1. Essential hypertension, benign - CMP14+EGFR  2. Hypothyroidism, unspecified hypothyroidism type - CMP14+EGFR - Thyroid Panel With TSH  3. Hyperlipidemia LDL goal <100 -  CMP14+EGFR - Lipid panel  4. Left flank pain  5. Arthritis - CMP14+EGFR - Arthritis Panel - HYDROcodone-acetaminophen (NORCO/VICODIN) 5-325 MG per tablet; Take 1 tablet by mouth every 6 (six) hours as needed for moderate pain.  Dispense: 60 tablet; Refill: 0  6. Encounter for vitamin deficiency screening  - Vit D  25 hydroxy (rtn osteoporosis monitoring)   Continue all meds Labs pending Health Maintenance reviewed Diet and exercise encouraged RTO 4 months  Evelina Dun, FNP

## 2014-08-24 NOTE — Patient Instructions (Signed)
Arthritis, Nonspecific °Arthritis is inflammation of a joint. This usually means pain, redness, warmth or swelling are present. One or more joints may be involved. There are a number of types of arthritis. Your caregiver may not be able to tell what type of arthritis you have right away. °CAUSES  °The most common cause of arthritis is the wear and tear on the joint (osteoarthritis). This causes damage to the cartilage, which can break down over time. The knees, hips, back and neck are most often affected by this type of arthritis. °Other types of arthritis and common causes of joint pain include: °· Sprains and other injuries near the joint. Sometimes minor sprains and injuries cause pain and swelling that develop hours later. °· Rheumatoid arthritis. This affects hands, feet and knees. It usually affects both sides of your body at the same time. It is often associated with chronic ailments, fever, weight loss and general weakness. °· Crystal arthritis. Gout and pseudo gout can cause occasional acute severe pain, redness and swelling in the foot, ankle, or knee. °· Infectious arthritis. Bacteria can get into a joint through a break in overlying skin. This can cause infection of the joint. Bacteria and viruses can also spread through the blood and affect your joints. °· Drug, infectious and allergy reactions. Sometimes joints can become mildly painful and slightly swollen with these types of illnesses. °SYMPTOMS  °· Pain is the main symptom. °· Your joint or joints can also be red, swollen and warm or hot to the touch. °· You may have a fever with certain types of arthritis, or even feel overall ill. °· The joint with arthritis will hurt with movement. Stiffness is present with some types of arthritis. °DIAGNOSIS  °Your caregiver will suspect arthritis based on your description of your symptoms and on your exam. Testing may be needed to find the type of arthritis: °· Blood and sometimes urine tests. °· X-ray tests  and sometimes CT or MRI scans. °· Removal of fluid from the joint (arthrocentesis) is done to check for bacteria, crystals or other causes. Your caregiver (or a specialist) will numb the area over the joint with a local anesthetic, and use a needle to remove joint fluid for examination. This procedure is only minimally uncomfortable. °· Even with these tests, your caregiver may not be able to tell what kind of arthritis you have. Consultation with a specialist (rheumatologist) may be helpful. °TREATMENT  °Your caregiver will discuss with you treatment specific to your type of arthritis. If the specific type cannot be determined, then the following general recommendations may apply. °Treatment of severe joint pain includes: °· Rest. °· Elevation. °· Anti-inflammatory medication (for example, ibuprofen) may be prescribed. Avoiding activities that cause increased pain. °· Only take over-the-counter or prescription medicines for pain and discomfort as recommended by your caregiver. °· Cold packs over an inflamed joint may be used for 10 to 15 minutes every hour. Hot packs sometimes feel better, but do not use overnight. Do not use hot packs if you are diabetic without your caregiver's permission. °· A cortisone shot into arthritic joints may help reduce pain and swelling. °· Any acute arthritis that gets worse over the next 1 to 2 days needs to be looked at to be sure there is no joint infection. °Long-term arthritis treatment involves modifying activities and lifestyle to reduce joint stress jarring. This can include weight loss. Also, exercise is needed to nourish the joint cartilage and remove waste. This helps keep the muscles   around the joint strong. °HOME CARE INSTRUCTIONS  °· Do not take aspirin to relieve pain if gout is suspected. This elevates uric acid levels. °· Only take over-the-counter or prescription medicines for pain, discomfort or fever as directed by your caregiver. °· Rest the joint as much as  possible. °· If your joint is swollen, keep it elevated. °· Use crutches if the painful joint is in your leg. °· Drinking plenty of fluids may help for certain types of arthritis. °· Follow your caregiver's dietary instructions. °· Try low-impact exercise such as: °¨ Swimming. °¨ Water aerobics. °¨ Biking. °¨ Walking. °· Morning stiffness is often relieved by a warm shower. °· Put your joints through regular range-of-motion. °SEEK MEDICAL CARE IF:  °· You do not feel better in 24 hours or are getting worse. °· You have side effects to medications, or are not getting better with treatment. °SEEK IMMEDIATE MEDICAL CARE IF:  °· You have a fever. °· You develop severe joint pain, swelling or redness. °· Many joints are involved and become painful and swollen. °· There is severe back pain and/or leg weakness. °· You have loss of bowel or bladder control. °Document Released: 08/16/2004 Document Revised: 10/01/2011 Document Reviewed: 09/01/2008 °[employer]® Patient Information ©2015 [employer]. This information is not intended to replace advice given to you by your health care provider. Make sure you discuss any questions you have with your health care provider. ° °

## 2014-08-25 LAB — THYROID PANEL WITH TSH
FREE THYROXINE INDEX: 2.6 (ref 1.2–4.9)
T3 Uptake Ratio: 27 % (ref 24–39)
T4 TOTAL: 9.8 ug/dL (ref 4.5–12.0)
TSH: 1.22 u[IU]/mL (ref 0.450–4.500)

## 2014-08-25 LAB — ARTHRITIS PANEL
BASOS ABS: 0 10*3/uL (ref 0.0–0.2)
Basos: 0 %
EOS: 1 %
Eosinophils Absolute: 0.2 10*3/uL (ref 0.0–0.4)
HCT: 46.5 % (ref 34.0–46.6)
Hemoglobin: 15.6 g/dL (ref 11.1–15.9)
Immature Grans (Abs): 0 10*3/uL (ref 0.0–0.1)
Immature Granulocytes: 0 %
Lymphocytes Absolute: 3.6 10*3/uL — ABNORMAL HIGH (ref 0.7–3.1)
Lymphs: 27 %
MCH: 29 pg (ref 26.6–33.0)
MCHC: 33.5 g/dL (ref 31.5–35.7)
MCV: 86 fL (ref 79–97)
MONOCYTES: 5 %
Monocytes Absolute: 0.7 10*3/uL (ref 0.1–0.9)
NEUTROS ABS: 8.9 10*3/uL — AB (ref 1.4–7.0)
Neutrophils Relative %: 67 %
Platelets: 297 10*3/uL (ref 150–379)
RBC: 5.38 x10E6/uL — ABNORMAL HIGH (ref 3.77–5.28)
RDW: 14.4 % (ref 12.3–15.4)
RHEUMATOID FACTOR: 9.2 [IU]/mL (ref 0.0–13.9)
Sed Rate: 13 mm/hr (ref 0–40)
Uric Acid: 5.9 mg/dL (ref 2.5–7.1)
WBC: 13.4 10*3/uL — ABNORMAL HIGH (ref 3.4–10.8)

## 2014-08-25 LAB — CMP14+EGFR
ALT: 21 IU/L (ref 0–32)
AST: 24 IU/L (ref 0–40)
Albumin/Globulin Ratio: 1.8 (ref 1.1–2.5)
Albumin: 4.2 g/dL (ref 3.6–4.8)
Alkaline Phosphatase: 116 IU/L (ref 39–117)
BILIRUBIN TOTAL: 0.8 mg/dL (ref 0.0–1.2)
BUN/Creatinine Ratio: 23 (ref 11–26)
BUN: 17 mg/dL (ref 8–27)
CO2: 26 mmol/L (ref 18–29)
CREATININE: 0.75 mg/dL (ref 0.57–1.00)
Calcium: 9.2 mg/dL (ref 8.7–10.3)
Chloride: 99 mmol/L (ref 97–108)
GFR calc non Af Amer: 86 mL/min/{1.73_m2} (ref >59)
GFR, EST AFRICAN AMERICAN: 99 mL/min/{1.73_m2} (ref >59)
GLOBULIN, TOTAL: 2.4 g/dL (ref 1.5–4.5)
Glucose: 85 mg/dL (ref 65–99)
POTASSIUM: 4.2 mmol/L (ref 3.5–5.2)
Sodium: 141 mmol/L (ref 134–144)
Total Protein: 6.6 g/dL (ref 6.0–8.5)

## 2014-08-25 LAB — LIPID PANEL
Chol/HDL Ratio: 2.7 ratio units (ref 0.0–4.4)
Cholesterol, Total: 146 mg/dL (ref 100–199)
HDL: 55 mg/dL (ref >39)
LDL Calculated: 66 mg/dL (ref 0–99)
Triglycerides: 125 mg/dL (ref 0–149)
VLDL Cholesterol Cal: 25 mg/dL (ref 5–40)

## 2014-08-25 LAB — VITAMIN D 25 HYDROXY (VIT D DEFICIENCY, FRACTURES): Vit D, 25-Hydroxy: 34.1 ng/mL (ref 30.0–100.0)

## 2014-08-26 ENCOUNTER — Telehealth: Payer: Self-pay | Admitting: Family Medicine

## 2014-08-26 NOTE — Telephone Encounter (Signed)
-----   Message from Sharion Balloon, [place] sent at 08/25/2014  9:53 AM EST ----- Kidney and liver function stable Cholesterol levels WNL Thyroid levels WNL Vit D levels WNL Arthritis Panel (Urice Acid, Rhuematoid, Hgb, & Plts)-WNL

## 2014-08-27 LAB — SPECIMEN STATUS REPORT

## 2014-12-22 ENCOUNTER — Other Ambulatory Visit: Payer: Self-pay | Admitting: Nurse Practitioner

## 2014-12-22 ENCOUNTER — Other Ambulatory Visit: Payer: Self-pay | Admitting: Family Medicine

## 2014-12-23 ENCOUNTER — Ambulatory Visit: Payer: No Typology Code available for payment source | Admitting: Family

## 2015-01-14 ENCOUNTER — Encounter: Payer: Self-pay | Admitting: Family

## 2015-01-14 ENCOUNTER — Ambulatory Visit (INDEPENDENT_AMBULATORY_CARE_PROVIDER_SITE_OTHER): Payer: No Typology Code available for payment source | Admitting: Family

## 2015-01-14 VITALS — BP 133/85 | HR 84 | Temp 97.7°F | Ht 67.0 in | Wt 281.4 lb

## 2015-01-14 DIAGNOSIS — E785 Hyperlipidemia, unspecified: Secondary | ICD-10-CM | POA: Diagnosis not present

## 2015-01-14 DIAGNOSIS — J069 Acute upper respiratory infection, unspecified: Secondary | ICD-10-CM

## 2015-01-14 DIAGNOSIS — M1990 Unspecified osteoarthritis, unspecified site: Secondary | ICD-10-CM

## 2015-01-14 DIAGNOSIS — E039 Hypothyroidism, unspecified: Secondary | ICD-10-CM | POA: Diagnosis not present

## 2015-01-14 DIAGNOSIS — I10 Essential (primary) hypertension: Secondary | ICD-10-CM

## 2015-01-14 MED ORDER — HYDROCODONE-ACETAMINOPHEN 5-325 MG PO TABS: 1.0000 | ORAL_TABLET | Freq: Four times a day (QID) | ORAL | Status: DC | PRN

## 2015-01-14 MED ORDER — AZITHROMYCIN 250 MG PO TABS
ORAL_TABLET | ORAL | Status: DC
Start: 2015-01-14 — End: 2015-02-25

## 2015-01-14 NOTE — Progress Notes (Signed)
Subjective:    Patient ID: Brianna Kelly, female    DOB: 02/28/1951, 64 y.o.   MRN: [id number]  Hypertension This is a chronic problem. The current episode started more than 1 year ago. The problem has been resolved since onset. The problem is controlled. Pertinent negatives include no anxiety, headaches, palpitations, peripheral edema or shortness of breath. Risk factors for coronary artery disease include dyslipidemia, obesity, post-menopausal state and sedentary lifestyle. Past treatments include ACE inhibitors and diuretics. The current treatment provides mild improvement. Hypertensive end-organ damage includes kidney disease and a thyroid problem. There is no history of CAD/MI, CVA or heart failure. There is no history of sleep apnea.  Hyperlipidemia This is a chronic problem. The current episode started more than 1 year ago. The problem is controlled. Recent lipid tests were reviewed and are normal. Exacerbating diseases include hypothyroidism and obesity. She has no history of diabetes. Factors aggravating her hyperlipidemia include fatty foods. Pertinent negatives include no leg pain, myalgias or shortness of breath. Current antihyperlipidemic treatment includes statins. The current treatment provides moderate improvement of lipids. Risk factors for coronary artery disease include dyslipidemia, hypertension, obesity, post-menopausal and a sedentary lifestyle.  Thyroid Problem Presents for follow-up visit. Symptoms include diarrhea, dry skin and weight gain. Patient reports no anxiety, depressed mood, hair loss, hoarse voice, leg swelling or palpitations. The symptoms have been stable. Past treatments include levothyroxine. The treatment provided significant relief. Her past medical history is significant for hyperlipidemia. There is no history of diabetes or heart failure.  Otalgia  There is pain in the right ear. This is a new problem. The current episode started 1 to 4 weeks ago. The problem  occurs constantly. The problem has been waxing and waning. There has been no fever. The pain is at a severity of 7/10. The pain is moderate. Associated symptoms include coughing, diarrhea, ear discharge, rhinorrhea and a sore throat. Pertinent negatives include no headaches. She has tried acetaminophen for the symptoms. The treatment provided mild relief.      Review of Systems  Constitutional: Positive for weight gain.  HENT: Positive for ear discharge, ear pain, rhinorrhea and sore throat. Negative for hoarse voice.   Eyes: Negative.   Respiratory: Positive for cough. Negative for shortness of breath.   Cardiovascular: Negative.  Negative for palpitations.  Gastrointestinal: Positive for diarrhea.  Endocrine: Negative.   Genitourinary: Negative.   Musculoskeletal: Negative.  Negative for myalgias.  Neurological: Negative.  Negative for headaches.  Hematological: Negative.   Psychiatric/Behavioral: Negative.  The patient is not nervous/anxious.   All other systems reviewed and are negative.      Objective:   Physical Exam  Constitutional: She is oriented to person, place, and time. She appears well-developed and well-nourished. No distress.  HENT:  Head: Normocephalic and atraumatic.  Right Ear: There is swelling and tenderness.  Left Ear: There is swelling and tenderness.  Mouth/Throat: Oropharynx is clear and moist.  Eyes: Pupils are equal, round, and reactive to light.  Neck: Normal range of motion. Neck supple. No thyromegaly present.  Cardiovascular: Normal rate, regular rhythm, normal heart sounds and intact distal pulses.   No murmur heard. Pulmonary/Chest: Effort normal and breath sounds normal. No respiratory distress. She has no wheezes.  Abdominal: Soft. Bowel sounds are normal. She exhibits no distension. There is no tenderness.  Musculoskeletal: Normal range of motion. She exhibits no edema or tenderness.  Neurological: She is alert and oriented to person, place,  and time. She has normal reflexes. No  cranial nerve deficit.  Skin: Skin is warm and dry.  Psychiatric: She has a normal mood and affect. Her behavior is normal. Judgment and thought content normal.  Vitals reviewed.   Blood pressure 133/85, pulse 84, temperature 97.7 F (36.5 C), temperature source Oral, height 5' 7"  (1.702 m), weight 281 lb 6.4 oz (127.642 kg).       Assessment & Plan:  1. Hyperlipidemia LDL goal <100 - CMP14+EGFR - Lipid panel  2. Hypothyroidism, unspecified hypothyroidism type - CMP14+EGFR - Thyroid Panel With TSH  3. Essential hypertension, benign - CMP14+EGFR  4. Acute upper respiratory infection -- Take meds as prescribed - Use a cool mist humidifier  -Use saline nose sprays frequently -Saline irrigations of the nose can be very helpful if done frequently.  * 4X daily for 1 week*  * Use of a nettie pot can be helpful with this. Follow directions with this* -Force fluids -For any cough or congestion  Use plain Mucinex- regular strength or max strength is fine   * Children- consult with Pharmacist for dosing -For fever or aces or pains- take tylenol or ibuprofen appropriate for age and weight.  * for fevers greater than 101 orally you may alternate ibuprofen and tylenol every  3 hours. -Throat lozenges if help - CMP14+EGFR - azithromycin (ZITHROMAX) 250 MG tablet; Take 500 mg once, then 250 mg for four days  Dispense: 6 tablet; Refill: 0  5. Arthritis -Encourage exercise - HYDROcodone-acetaminophen (NORCO/VICODIN) 5-325 MG per tablet; Take 1 tablet by mouth every 6 (six) hours as needed for moderate pain.  Dispense: 60 tablet; Refill: 0   Continue all meds Labs pending Health Maintenance reviewed Diet and exercise encouraged RTO 4 months  Evelina Dun, FNP

## 2015-01-14 NOTE — Addendum Note (Signed)
Addended by: Earlene Plater on: 01/14/2015 11:38 AM   Modules accepted: Miquel Dunn

## 2015-01-14 NOTE — Addendum Note (Signed)
Addended by: Earlene Plater on: 01/14/2015 11:39 AM   Modules accepted: Miquel Dunn

## 2015-01-14 NOTE — Patient Instructions (Addendum)

## 2015-01-15 LAB — CMP14+EGFR
ALT: 18 IU/L (ref 0–32)
AST: 16 IU/L (ref 0–40)
Albumin/Globulin Ratio: 1.4 (ref 1.1–2.5)
Albumin: 3.8 g/dL (ref 3.6–4.8)
Alkaline Phosphatase: 126 IU/L — ABNORMAL HIGH (ref 39–117)
BUN/Creatinine Ratio: 26 (ref 11–26)
BUN: 19 mg/dL (ref 8–27)
Bilirubin Total: 0.6 mg/dL (ref 0.0–1.2)
CO2: 27 mmol/L (ref 18–29)
Calcium: 9 mg/dL (ref 8.7–10.3)
Chloride: 100 mmol/L (ref 97–108)
Creatinine, Ser: 0.73 mg/dL (ref 0.57–1.00)
GFR calc Af Amer: 102 mL/min/{1.73_m2} (ref >59)
GFR calc non Af Amer: 89 mL/min/{1.73_m2} (ref >59)
GLUCOSE: 95 mg/dL (ref 65–99)
Globulin, Total: 2.7 g/dL (ref 1.5–4.5)
Potassium: 4.1 mmol/L (ref 3.5–5.2)
Sodium: 142 mmol/L (ref 134–144)
Total Protein: 6.5 g/dL (ref 6.0–8.5)

## 2015-01-15 LAB — LIPID PANEL
CHOL/HDL RATIO: 3.1 ratio (ref 0.0–4.4)
Cholesterol, Total: 150 mg/dL (ref 100–199)
HDL: 49 mg/dL (ref >39)
LDL CALC: 74 mg/dL (ref 0–99)
Triglycerides: 137 mg/dL (ref 0–149)
VLDL CHOLESTEROL CAL: 27 mg/dL (ref 5–40)

## 2015-01-15 LAB — THYROID PANEL WITH TSH
Free Thyroxine Index: 2.6 (ref 1.2–4.9)
T3 Uptake Ratio: 29 % (ref 24–39)
T4 TOTAL: 9 ug/dL (ref 4.5–12.0)
TSH: 2.64 u[IU]/mL (ref 0.450–4.500)

## 2015-01-29 ENCOUNTER — Other Ambulatory Visit: Payer: Self-pay | Admitting: Family Medicine

## 2015-02-25 ENCOUNTER — Encounter: Payer: Self-pay | Admitting: Family Medicine

## 2015-02-25 ENCOUNTER — Ambulatory Visit (INDEPENDENT_AMBULATORY_CARE_PROVIDER_SITE_OTHER): Payer: No Typology Code available for payment source | Admitting: Family Medicine

## 2015-02-25 VITALS — BP 140/79 | HR 90 | Temp 97.5°F | Ht 67.0 in | Wt 292.0 lb

## 2015-02-25 DIAGNOSIS — M792 Neuralgia and neuritis, unspecified: Secondary | ICD-10-CM | POA: Diagnosis not present

## 2015-02-25 MED ORDER — PREDNISONE 10 MG PO TABS: ORAL_TABLET | ORAL | Status: DC

## 2015-02-25 NOTE — Progress Notes (Signed)
Subjective:  Patient ID: Brianna Kelly, female    DOB: 08/02/1950  Age: 64 y.o. MRN: [id number]  CC: Arm Pain   HPI Brianna Kelly presents for 3 years of right arm pain. 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 It has markedly increased over the last few weeks. She describes repetitive motion with her hands and arms. She has 7/10 pain. It is focused in the right forearm but radiates all the way from the neck across the shoulder down the upper arm and all the way to the fingertips. She describes it as a burning sensation. It is not relieved by rest or activity. She can rub it and get some relief periodically.  History Brianna Kelly has a past medical history of Thyroid disease; Hypertension; Hyperlipidemia; Vitiligo; and Kidney failure (2009).   She has past surgical history that includes Replacement total knee (Left, 2011); Cholecystectomy (2001); and Tubal ligation (1976).   Her family history includes Anxiety disorder in her mother; Cancer in her sister; Diabetes in her father; Lung disease in her mother; Obesity in her father. There is no history of Colon cancer.She reports that she has never smoked. She has never used smokeless tobacco. She reports that she does not drink alcohol or use illicit drugs.  Outpatient Prescriptions Prior to Visit  Medication Sig Dispense Refill  . atorvastatin (LIPITOR) 20 MG tablet Take 1 tablet (20 mg total) by mouth daily at 6 PM. 30 tablet 5  . benazepril (LOTENSIN) 5 MG tablet TAKE ONE TABLET BY MOUTH ONCE DAILY 30 tablet 1  . clindamycin-benzoyl peroxide (BENZACLIN) gel     . clobetasol cream (TEMOVATE) 0.05 %     . fluocinonide ointment (LIDEX) 0.05 %     . furosemide (LASIX) 20 MG tablet Take 1 tablet (20 mg total) by mouth daily. 30 tablet 5  . levothyroxine (SYNTHROID, LEVOTHROID) 100 MCG tablet TAKE ONE TABLET BY MOUTH ONCE DAILY 30 tablet 6  . metroNIDAZOLE (METROGEL) 1 % gel     . HYDROcodone-acetaminophen  (NORCO/VICODIN) 5-325 MG per tablet Take 1 tablet by mouth every 6 (six) hours as needed for moderate pain. (Patient not taking: Reported on 02/25/2015) 60 tablet 0  . atorvastatin (LIPITOR) 20 MG tablet TAKE ONE TABLET BY MOUTH ONCE DAILY AT SIX IN THE EVENING (Patient not taking: Reported on 02/25/2015) 30 tablet 4  . azithromycin (ZITHROMAX) 250 MG tablet Take 500 mg once, then 250 mg for four days (Patient not taking: Reported on 02/25/2015) 6 tablet 0   No facility-administered medications prior to visit.    ROS Review of Systems  Constitutional: Negative for fever, chills, diaphoresis, appetite change, fatigue and unexpected weight change.  HENT: Negative for congestion, ear pain, hearing loss, postnasal drip, rhinorrhea, sneezing, sore throat and trouble swallowing.   Eyes: Negative for pain.  Respiratory: Negative for cough, chest tightness and shortness of breath.   Cardiovascular: Negative for chest pain and palpitations.  Gastrointestinal: Negative for nausea, vomiting, abdominal pain, diarrhea and constipation.  Genitourinary: Negative for dysuria, frequency and menstrual problem.  Musculoskeletal: Negative for joint swelling and arthralgias.  Skin: Negative for rash.  Neurological: Negative for dizziness, weakness, numbness and headaches.  Psychiatric/Behavioral: Negative for dysphoric mood and agitation.    Objective:  BP 140/79 mmHg  Pulse 90  Temp(Src) 97.5 F (36.4 C) (Oral)  Ht 5\' 7"  (1.702 m)  Wt 292 lb (132.45 kg)  BMI 45.72 kg/m2  BP Readings from Last 3 Encounters:  02/25/15 140/79  01/14/15 133/85  08/24/14 128/71    Wt Readings from Last 3 Encounters:  02/25/15 292 lb (132.45 kg)  01/14/15 281 lb 6.4 oz (127.642 kg)  08/24/14 271 lb 6.4 oz (123.106 kg)     Physical Exam  Constitutional: She is oriented to person, place, and time. She appears well-developed and well-nourished. No distress.  HENT:  Head: Normocephalic and atraumatic.  Right Ear:  External ear normal.  Left Ear: External ear normal.  Nose: Nose normal.  Mouth/Throat: Oropharynx is clear and moist.  Eyes: Conjunctivae and EOM are normal. Pupils are equal, round, and reactive to light.  Neck: Normal range of motion. Neck supple. No thyromegaly present.  Cardiovascular: Normal rate, regular rhythm and normal heart sounds.   No murmur heard. Pulmonary/Chest: Effort normal and breath sounds normal. No respiratory distress. She has no wheezes. She has no rales.  Abdominal: Soft. Bowel sounds are normal. She exhibits no distension. There is no tenderness.  Musculoskeletal: Normal range of motion. She exhibits tenderness (mild to moderate for compression of the right forearm. Negative Tinel and Phalen. Right upper extremity). She exhibits no edema.  Lymphadenopathy:    She has no cervical adenopathy.  Neurological: She is alert and oriented to person, place, and time. She has normal reflexes.  Skin: Skin is warm and dry.  Psychiatric: She has a normal mood and affect. Her behavior is normal. Judgment and thought content normal.    No results found for: HGBA1C  Lab Results  Component Value Date   WBC 13.4* 08/24/2014   HGB 15.6 08/24/2014   HCT 46.5 08/24/2014   PLT 297 08/24/2014   GLUCOSE 95 01/14/2015   CHOL 150 01/14/2015   TRIG 137 01/14/2015   HDL 49 01/14/2015   LDLCALC 74 01/14/2015   ALT 18 01/14/2015   AST 16 01/14/2015   NA 142 01/14/2015   K 4.1 01/14/2015   CL 100 01/14/2015   CREATININE 0.73 01/14/2015   BUN 19 01/14/2015   CO2 27 01/14/2015   TSH 2.640 01/14/2015    No results found.  Assessment & Plan:   Brianna Kelly was seen today for arm pain.  Diagnoses and all orders for this visit:  Neuralgia  Other orders -     predniSONE (DELTASONE) 10 MG tablet; Take 5 daily for 3 days followed by 4,3,2 and 1 for 3 days each.   I have discontinued Brianna Kelly's azithromycin. I am also having her start on predniSONE. Additionally, I am having her  maintain her clindamycin-benzoyl peroxide, clobetasol cream, fluocinonide ointment, metroNIDAZOLE, atorvastatin, furosemide, levothyroxine, benazepril, and HYDROcodone-acetaminophen.  Meds ordered this encounter  Medications  . predniSONE (DELTASONE) 10 MG tablet    Sig: Take 5 daily for 3 days followed by 4,3,2 and 1 for 3 days each.    Dispense:  45 tablet    Refill:  0     Follow-up: Return in about 2 weeks (around 03/11/2015), or if symptoms worsen or fail to improve.  Claretta Fraise, M.D.

## 2015-03-11 ENCOUNTER — Ambulatory Visit: Payer: No Typology Code available for payment source | Admitting: Family Medicine

## 2015-04-06 ENCOUNTER — Other Ambulatory Visit: Payer: Self-pay | Admitting: General Practice

## 2015-04-06 ENCOUNTER — Other Ambulatory Visit: Payer: Self-pay | Admitting: Family

## 2015-04-14 ENCOUNTER — Ambulatory Visit (INDEPENDENT_AMBULATORY_CARE_PROVIDER_SITE_OTHER): Payer: No Typology Code available for payment source

## 2015-04-14 ENCOUNTER — Ambulatory Visit (INDEPENDENT_AMBULATORY_CARE_PROVIDER_SITE_OTHER): Payer: No Typology Code available for payment source | Admitting: Physician Assistant

## 2015-04-14 ENCOUNTER — Encounter: Payer: Self-pay | Admitting: Physician Assistant

## 2015-04-14 VITALS — BP 161/98 | HR 88 | Temp 97.5°F | Ht 67.0 in | Wt 299.6 lb

## 2015-04-14 DIAGNOSIS — R609 Edema, unspecified: Secondary | ICD-10-CM

## 2015-04-14 DIAGNOSIS — R0602 Shortness of breath: Secondary | ICD-10-CM

## 2015-04-14 DIAGNOSIS — I10 Essential (primary) hypertension: Secondary | ICD-10-CM

## 2015-04-14 DIAGNOSIS — E039 Hypothyroidism, unspecified: Secondary | ICD-10-CM | POA: Diagnosis not present

## 2015-04-14 NOTE — Patient Instructions (Signed)
Increase lasix to 2- 20mg  pills in the morning and 1 - 20mg  in the evening around 6pm No salt  Take weight in the morning and in the afternoon   Edema Edema is an abnormal buildup of fluids in your bodytissues. Edema is somewhatdependent on gravity to pull the fluid to the lowest place in your body. That makes the condition more common in the legs and thighs (lower extremities). Painless swelling of the feet and ankles is common and becomes more likely as you get older. It is also common in looser tissues, like around your eyes.  When the affected area is squeezed, the fluid may move out of that spot and leave a dent for a few moments. This dent is called pitting.  CAUSES  There are many possible causes of edema. Eating too much salt and being on your feet or sitting for a long time can cause edema in your legs and ankles. Hot weather may make edema worse. Common medical causes of edema include:  Heart failure.  Liver disease.  Kidney disease.  Weak blood vessels in your legs.  Cancer.  An injury.  Pregnancy.  Some medications.  Obesity. SYMPTOMS  Edema is usually painless.Your skin may look swollen or shiny.  DIAGNOSIS  Your health care provider may be able to diagnose edema by asking about your medical history and doing a physical exam. You may need to have tests such as X-rays, an electrocardiogram, or blood tests to check for medical conditions that may cause edema.  TREATMENT  Edema treatment depends on the cause. If you have heart, liver, or kidney disease, you need the treatment appropriate for these conditions. General treatment may include:  Elevation of the affected body part above the level of your heart.  Compression of the affected body part. Pressure from elastic bandages or support stockings squeezes the tissues and forces fluid back into the blood vessels. This keeps fluid from entering the tissues.  Restriction of fluid and salt intake.  Use of a water  pill (diuretic). These medications are appropriate only for some types of edema. They pull fluid out of your body and make you urinate more often. This gets rid of fluid and reduces swelling, but diuretics can have side effects. Only use diuretics as directed by your health care provider. HOME CARE INSTRUCTIONS   Keep the affected body part above the level of your heart when you are lying down.   Do not sit still or stand for prolonged periods.   Do not put anything directly under your knees when lying down.  Do not wear constricting clothing or garters on your upper legs.   Exercise your legs to work the fluid back into your blood vessels. This may help the swelling go down.   Wear elastic bandages or support stockings to reduce ankle swelling as directed by your health care provider.   Eat a low-salt diet to reduce fluid if your health care provider recommends it.   Only take medicines as directed by your health care provider. SEEK MEDICAL CARE IF:   Your edema is not responding to treatment.  You have heart, liver, or kidney disease and notice symptoms of edema.  You have edema in your legs that does not improve after elevating them.   You have sudden and unexplained weight gain. SEEK IMMEDIATE MEDICAL CARE IF:   You develop shortness of breath or chest pain.   You cannot breathe when you lie down.  You develop pain, redness, or  warmth in the swollen areas.   You have heart, liver, or kidney disease and suddenly get edema.  You have a fever and your symptoms suddenly get worse. MAKE SURE YOU:   Understand these instructions.  Will watch your condition.  Will get help right away if you are not doing well or get worse. Document Released: 07/09/2005 Document Revised: 11/23/2013 Document Reviewed: 05/01/2013 [facility] Patient Information 2015 [place], [place]. This information is not intended to replace advice given to you by your health care provider. Make sure  you discuss any questions you have with your health care provider.

## 2015-04-14 NOTE — Progress Notes (Signed)
   Subjective:    Patient ID: Brianna Kelly, female    DOB: January 29, 1951, 64 y.o.   MRN: [id number]  HPI 64 y/o female presents with c/o bilateral LE edema x 2 weeks. This is a chronic problem for her, however she has had increased swelling. She takes Lasix $RemoveBef'20mg'PxukrGPdtp$  q day but increased over the weekend to $RemoveBe'20mg'AxypDDNIm$  BID x 2 days. She denies eating increase amt of salt and states that she uses light salt or no salt.     Review of Systems  Constitutional: Negative.   HENT: Negative.   Respiratory: Positive for cough and shortness of breath.   Cardiovascular: Positive for leg swelling (bilateral, increased ).  Gastrointestinal: Negative.   Endocrine: Negative.   Genitourinary: Negative.   Musculoskeletal: Negative.   Skin: Negative.   Allergic/Immunologic: Negative.   Neurological: Negative.   Hematological: Negative.   Psychiatric/Behavioral: Negative.        Objective:   Physical Exam  Constitutional: She is oriented to person, place, and time. She appears well-developed and well-nourished.  Cardiovascular: Normal rate, regular rhythm and normal heart sounds.  Exam reveals no gallop and no friction rub.   No murmur heard. Hypertensive   Pulmonary/Chest: Effort normal and breath sounds normal. No respiratory distress. She has no wheezes. She has no rales. She exhibits no tenderness.  Possible heart enlargment on chest xray  Will wait for reading to determine  Musculoskeletal: Normal range of motion. She exhibits edema (bilateral LE). She exhibits no tenderness.  Neurological: She is alert and oriented to person, place, and time.  Psychiatric: She has a normal mood and affect. Her behavior is normal. Judgment and thought content normal.  Nursing note and vitals reviewed.         Assessment & Plan:  Discussed plan with Dr. Laurance Flatten. Will have patient out of work, elevated legs above heart until her follow up on Mon or labs return. No salt or salt substitute.   1. SOB (shortness of  breath)  - DG Chest 2 View - CMP14+EGFR - CBC with Differential/Platelet  2. Peripheral edema  - DG Chest 2 View - CBC with Differential/Platelet - Thyroid Panel With TSH - Brain natriuretic peptide  3. Essential hypertension  - CMP14+EGFR  4. Hypothyroidism, unspecified hypothyroidism type  - Thyroid Panel With TSH  RTC Monday 04/18/15  Tiffany A. Benjamin Stain PA-C

## 2015-04-15 ENCOUNTER — Encounter: Payer: No Typology Code available for payment source | Admitting: Pediatrics

## 2015-04-15 LAB — CBC WITH DIFFERENTIAL/PLATELET
BASOS ABS: 0 10*3/uL (ref 0.0–0.2)
Basos: 0 %
EOS (ABSOLUTE): 0.4 10*3/uL (ref 0.0–0.4)
Eos: 3 %
HEMOGLOBIN: 14.3 g/dL (ref 11.1–15.9)
Hematocrit: 43.3 % (ref 34.0–46.6)
Immature Grans (Abs): 0 10*3/uL (ref 0.0–0.1)
Immature Granulocytes: 0 %
LYMPHS ABS: 4.1 10*3/uL — AB (ref 0.7–3.1)
Lymphs: 34 %
MCH: 28 pg (ref 26.6–33.0)
MCHC: 33 g/dL (ref 31.5–35.7)
MCV: 85 fL (ref 79–97)
MONOCYTES: 7 %
Monocytes Absolute: 0.8 10*3/uL (ref 0.1–0.9)
NEUTROS ABS: 6.7 10*3/uL (ref 1.4–7.0)
Neutrophils: 56 %
Platelets: 339 10*3/uL (ref 150–379)
RBC: 5.11 x10E6/uL (ref 3.77–5.28)
RDW: 14.4 % (ref 12.3–15.4)
WBC: 11.9 10*3/uL — ABNORMAL HIGH (ref 3.4–10.8)

## 2015-04-15 LAB — BRAIN NATRIURETIC PEPTIDE: BNP: 18.1 pg/mL (ref 0.0–100.0)

## 2015-04-15 LAB — THYROID PANEL WITH TSH
FREE THYROXINE INDEX: 2.5 (ref 1.2–4.9)
T3 Uptake Ratio: 26 % (ref 24–39)
T4, Total: 9.8 ug/dL (ref 4.5–12.0)
TSH: 1.1 u[IU]/mL (ref 0.450–4.500)

## 2015-04-15 LAB — CMP14+EGFR
A/G RATIO: 1.7 (ref 1.1–2.5)
ALBUMIN: 4 g/dL (ref 3.6–4.8)
ALT: 22 IU/L (ref 0–32)
AST: 18 IU/L (ref 0–40)
Alkaline Phosphatase: 119 IU/L — ABNORMAL HIGH (ref 39–117)
BILIRUBIN TOTAL: 0.4 mg/dL (ref 0.0–1.2)
BUN / CREAT RATIO: 31 — AB (ref 11–26)
BUN: 23 mg/dL (ref 8–27)
CHLORIDE: 99 mmol/L (ref 97–108)
CO2: 26 mmol/L (ref 18–29)
Calcium: 9.4 mg/dL (ref 8.7–10.3)
Creatinine, Ser: 0.75 mg/dL (ref 0.57–1.00)
GFR calc non Af Amer: 86 mL/min/{1.73_m2} (ref >59)
GFR, EST AFRICAN AMERICAN: 99 mL/min/{1.73_m2} (ref >59)
GLOBULIN, TOTAL: 2.3 g/dL (ref 1.5–4.5)
Glucose: 102 mg/dL — ABNORMAL HIGH (ref 65–99)
POTASSIUM: 4 mmol/L (ref 3.5–5.2)
Sodium: 140 mmol/L (ref 134–144)
TOTAL PROTEIN: 6.3 g/dL (ref 6.0–8.5)

## 2015-04-18 ENCOUNTER — Ambulatory Visit (INDEPENDENT_AMBULATORY_CARE_PROVIDER_SITE_OTHER): Payer: No Typology Code available for payment source | Admitting: Family Medicine

## 2015-04-18 ENCOUNTER — Encounter: Payer: Self-pay | Admitting: Family Medicine

## 2015-04-18 VITALS — BP 129/72 | HR 83 | Temp 97.6°F | Ht 67.0 in | Wt 292.6 lb

## 2015-04-18 DIAGNOSIS — E785 Hyperlipidemia, unspecified: Secondary | ICD-10-CM

## 2015-04-18 DIAGNOSIS — R609 Edema, unspecified: Secondary | ICD-10-CM

## 2015-04-18 MED ORDER — ROSUVASTATIN CALCIUM 20 MG PO TABS: 20.0000 mg | ORAL_TABLET | Freq: Every day | ORAL | Status: DC

## 2015-04-18 NOTE — Assessment & Plan Note (Signed)
Patient had myalgia pains and would like to try a different statin. Sent Crestor and recommended CoQ10.

## 2015-04-18 NOTE — Progress Notes (Signed)
BP 129/72 mmHg  Pulse 83  Temp(Src) 97.6 F (36.4 C) (Oral)  Ht _0  (1.702 m)  Wt 292 lb 9.6 oz (132.722 kg)  BMI 45.82 kg/m2   Subjective:    Patient ID: Brianna Kelly, female    DOB: 10-15-1950, 64 y.o.   MRN: [id number]  HPI: Brianna Kelly is a 64 y.o. female presenting on 04/18/2015 for Bilateral Edema lower legs   HPI Leg swelling Patient had leg swelling that was increased when she saw Tiffany began on Friday 3 days ago. She doubled up her Lasix and that has been much improved. She has been making a lot of urine since that and also been off of her feet. No issues with shortness of breath or chest pain or abdominal swelling.  Hyperlipidemia Patient has been on Lipitor for hyperlipidemia and was concerned that her myalgia pains may not be due to fibromyalgia but may be from that medication and would like to try a different one.   Relevant past medical, surgical, family and social history reviewed and updated as indicated. Interim medical history since our last visit reviewed. Allergies and medications reviewed and updated.  Review of Systems  Constitutional: Negative for fever and chills.  HENT: Negative for congestion, ear discharge and ear pain.   Eyes: Negative for redness and visual disturbance.  Respiratory: Negative for chest tightness and shortness of breath.   Cardiovascular: Negative for chest pain, palpitations and leg swelling (resolved).  Genitourinary: Negative for dysuria and difficulty urinating.  Musculoskeletal: Positive for myalgias. Negative for back pain and gait problem.  Skin: Negative for rash.  Neurological: Negative for light-headedness and headaches.  Psychiatric/Behavioral: Negative for behavioral problems and agitation.  All other systems reviewed and are negative.   Per HPI unless specifically indicated above     Medication List       This list is accurate as of: 04/18/15 10:25 AM.  Always use your most recent med list.               benazepril 5 MG tablet  Commonly known as:  LOTENSIN  TAKE ONE TABLET BY MOUTH ONCE DAILY     clindamycin-benzoyl peroxide gel  Commonly known as:  BENZACLIN     clobetasol cream 0.05 %  Commonly known as:  TEMOVATE     cyclobenzaprine 10 MG tablet  Commonly known as:  FLEXERIL  TAKE ONE TABLET BY MOUTH AT BEDTIME     fluocinonide ointment 0.05 %  Commonly known as:  LIDEX     furosemide 20 MG tablet  Commonly known as:  LASIX  Take 1 tablet (20 mg total) by mouth daily.     furosemide 20 MG tablet  Commonly known as:  LASIX  TAKE ONE TABLET BY MOUTH ONCE DAILY     levothyroxine 100 MCG tablet  Commonly known as:  SYNTHROID, LEVOTHROID  TAKE ONE TABLET BY MOUTH ONCE DAILY     metroNIDAZOLE 1 % gel  Commonly known as:  METROGEL     rosuvastatin 20 MG tablet  Commonly known as:  CRESTOR  Take 1 tablet (20 mg total) by mouth daily.           Objective:    BP 129/72 mmHg  Pulse 83  Temp(Src) 97.6 F (36.4 C) (Oral)  Ht _1  (1.702 m)  Wt 292 lb 9.6 oz (132.722 kg)  BMI 45.82 kg/m2  Wt Readings from Last 3 Encounters:  04/18/15 292 lb 9.6 oz (132.722 kg)  04/14/15 299  lb 9.6 oz (135.898 kg)  02/25/15 292 lb (132.45 kg)    Physical Exam  Constitutional: She is oriented to person, place, and time. She appears well-developed and well-nourished. No distress.  Eyes: Conjunctivae and EOM are normal.  Cardiovascular: Normal rate, regular rhythm, normal heart sounds and intact distal pulses.   No murmur heard. Pulmonary/Chest: Effort normal and breath sounds normal. No respiratory distress. She has no wheezes.  Musculoskeletal: Normal range of motion. She exhibits no edema.  Generalized myalgia in bilateral lower extremities and upper extremities and back. Trigger point tenderness.  Neurological: She is alert and oriented to person, place, and time. Coordination normal.  Skin: Skin is warm and dry. No rash noted. She is not diaphoretic.  Psychiatric: She has a  normal mood and affect. Her behavior is normal.  Vitals reviewed.   Results for orders placed or performed in visit on 04/14/15  CMP14+EGFR  Result Value Ref Range   Glucose 102 (H) 65 - 99 mg/dL   BUN 23 8 - 27 mg/dL   Creatinine, Ser 0.75 0.57 - 1.00 mg/dL   GFR calc non Af Amer 86 >59 mL/min/1.73   GFR calc Af Amer 99 >59 mL/min/1.73   BUN/Creatinine Ratio 31 (H) 11 - 26   Sodium 140 134 - 144 mmol/L   Potassium 4.0 3.5 - 5.2 mmol/L   Chloride 99 97 - 108 mmol/L   CO2 26 18 - 29 mmol/L   Calcium 9.4 8.7 - 10.3 mg/dL   Total Protein 6.3 6.0 - 8.5 g/dL   Albumin 4.0 3.6 - 4.8 g/dL   Globulin, Total 2.3 1.5 - 4.5 g/dL   Albumin/Globulin Ratio 1.7 1.1 - 2.5   Bilirubin Total 0.4 0.0 - 1.2 mg/dL   Alkaline Phosphatase 119 (H) 39 - 117 IU/L   AST 18 0 - 40 IU/L   ALT 22 0 - 32 IU/L  CBC with Differential/Platelet  Result Value Ref Range   WBC 11.9 (H) 3.4 - 10.8 x10E3/uL   RBC 5.11 3.77 - 5.28 x10E6/uL   Hemoglobin 14.3 11.1 - 15.9 g/dL   Hematocrit 43.3 34.0 - 46.6 %   MCV 85 79 - 97 fL   MCH 28.0 26.6 - 33.0 pg   MCHC 33.0 31.5 - 35.7 g/dL   RDW 14.4 12.3 - 15.4 %   Platelets 339 150 - 379 x10E3/uL   Neutrophils 56 %   Lymphs 34 %   Monocytes 7 %   Eos 3 %   Basos 0 %   Neutrophils Absolute 6.7 1.4 - 7.0 x10E3/uL   Lymphocytes Absolute 4.1 (H) 0.7 - 3.1 x10E3/uL   Monocytes Absolute 0.8 0.1 - 0.9 x10E3/uL   EOS (ABSOLUTE) 0.4 0.0 - 0.4 x10E3/uL   Basophils Absolute 0.0 0.0 - 0.2 x10E3/uL   Immature Granulocytes 0 %   Immature Grans (Abs) 0.0 0.0 - 0.1 x10E3/uL  Brain natriuretic peptide  Result Value Ref Range   BNP 18.1 0.0 - 100.0 pg/mL  Thyroid Panel With TSH  Result Value Ref Range   TSH 1.100 0.450 - 4.500 uIU/mL   T4, Total 9.8 4.5 - 12.0 ug/dL   T3 Uptake Ratio 26 24 - 39 %   Free Thyroxine Index 2.5 1.2 - 4.9      Assessment & Plan:   Problem List Items Addressed This Visit      Other   Hyperlipidemia LDL goal <100 - Primary    Patient had  myalgia pains and would like to try a  different statin. Sent Crestor and recommended CoQ10.      Relevant Medications   rosuvastatin (CRESTOR) 20 MG tablet    Other Visit Diagnoses    Swelling        Swelling much improved, recommended to go back to the lower dose of Lasix for should've impacts her kidneys.        Follow up plan: Return in about 1 month (around 05/18/2015), or if symptoms worsen or fail to improve.  Caryl Pina, MD [facility] 04/18/2015, 10:25 AM

## 2015-05-15 ENCOUNTER — Other Ambulatory Visit: Payer: Self-pay | Admitting: Family Medicine

## 2015-05-20 ENCOUNTER — Encounter: Payer: Self-pay | Admitting: Family

## 2015-05-20 ENCOUNTER — Ambulatory Visit (INDEPENDENT_AMBULATORY_CARE_PROVIDER_SITE_OTHER): Payer: No Typology Code available for payment source | Admitting: Family

## 2015-05-20 VITALS — BP 166/106 | HR 86 | Temp 97.5°F | Ht 67.0 in | Wt 295.0 lb

## 2015-05-20 DIAGNOSIS — Z1159 Encounter for screening for other viral diseases: Secondary | ICD-10-CM | POA: Diagnosis not present

## 2015-05-20 DIAGNOSIS — M546 Pain in thoracic spine: Secondary | ICD-10-CM | POA: Diagnosis not present

## 2015-05-20 DIAGNOSIS — E8881 Metabolic syndrome: Secondary | ICD-10-CM | POA: Insufficient documentation

## 2015-05-20 DIAGNOSIS — E039 Hypothyroidism, unspecified: Secondary | ICD-10-CM

## 2015-05-20 DIAGNOSIS — E785 Hyperlipidemia, unspecified: Secondary | ICD-10-CM | POA: Diagnosis not present

## 2015-05-20 DIAGNOSIS — I10 Essential (primary) hypertension: Secondary | ICD-10-CM | POA: Diagnosis not present

## 2015-05-20 DIAGNOSIS — R609 Edema, unspecified: Secondary | ICD-10-CM | POA: Diagnosis not present

## 2015-05-20 DIAGNOSIS — R600 Localized edema: Secondary | ICD-10-CM | POA: Insufficient documentation

## 2015-05-20 LAB — POCT GLYCOSYLATED HEMOGLOBIN (HGB A1C): Hemoglobin A1C: 5.9

## 2015-05-20 MED ORDER — TRAMADOL HCL 50 MG PO TABS: 50.0000 mg | ORAL_TABLET | Freq: Three times a day (TID) | ORAL | Status: DC | PRN

## 2015-05-20 MED ORDER — CYCLOBENZAPRINE HCL 10 MG PO TABS: 10.0000 mg | ORAL_TABLET | Freq: Every day | ORAL | Status: DC

## 2015-05-20 MED ORDER — LISINOPRIL-HYDROCHLOROTHIAZIDE 20-12.5 MG PO TABS: 1.0000 | ORAL_TABLET | Freq: Every day | ORAL | Status: DC

## 2015-05-20 MED ORDER — LEVOTHYROXINE SODIUM 100 MCG PO TABS: 100.0000 ug | ORAL_TABLET | Freq: Every day | ORAL | Status: DC

## 2015-05-20 NOTE — Progress Notes (Signed)
Subjective:    Patient ID: Brianna Kelly, female    DOB: 08-28-1950, 64 y.o.   MRN: [id number]  Pt presents to the office today for chronic follow up.  Hyperlipidemia This is a chronic problem. The current episode started more than 1 year ago. The problem is controlled. Recent lipid tests were reviewed and are normal. Exacerbating diseases include hypothyroidism and obesity. She has no history of diabetes. Factors aggravating her hyperlipidemia include fatty foods. Pertinent negatives include no leg pain, myalgias or shortness of breath. Current antihyperlipidemic treatment includes statins. The current treatment provides moderate improvement of lipids. Risk factors for coronary artery disease include dyslipidemia, hypertension, obesity, post-menopausal and a sedentary lifestyle.  Hypertension This is a chronic problem. The current episode started more than 1 year ago. The problem has been resolved since onset. The problem is uncontrolled. Pertinent negatives include no anxiety, palpitations, peripheral edema or shortness of breath. Risk factors for coronary artery disease include dyslipidemia, obesity, post-menopausal state and sedentary lifestyle. Past treatments include ACE inhibitors and diuretics. The current treatment provides mild improvement. Hypertensive end-organ damage includes kidney disease and a thyroid problem. There is no history of CAD/MI, CVA or heart failure. There is no history of sleep apnea.  Thyroid Problem Presents for follow-up visit. Symptoms include dry skin and weight gain. Patient reports no anxiety, depressed mood, hair loss, hoarse voice, leg swelling or palpitations. The symptoms have been stable. Past treatments include levothyroxine. The treatment provided significant relief. Her past medical history is significant for hyperlipidemia. There is no history of diabetes or heart failure.  Back Pain This is a new problem. The current episode started more than 1 month ago. The  problem occurs constantly. The problem is unchanged. The pain is present in the lumbar spine. The quality of the pain is described as aching. The pain is at a severity of 7/10. The pain is moderate. The symptoms are aggravated by bending and position. Pertinent negatives include no bladder incontinence, bowel incontinence, dysuria, leg pain, numbness, tingling or weakness. She has tried NSAIDs for the symptoms. The treatment provided mild relief.      Review of Systems  Constitutional: Positive for weight gain.  HENT: Negative for hoarse voice.   Eyes: Negative.   Respiratory: Negative for shortness of breath.   Cardiovascular: Negative.  Negative for palpitations.  Gastrointestinal: Negative for bowel incontinence.  Endocrine: Negative.   Genitourinary: Negative.  Negative for bladder incontinence and dysuria.  Musculoskeletal: Positive for back pain. Negative for myalgias.  Neurological: Negative.  Negative for tingling, weakness and numbness.  Hematological: Negative.   Psychiatric/Behavioral: Negative.  The patient is not nervous/anxious.   All other systems reviewed and are negative.      Objective:   Physical Exam  Constitutional: She is oriented to person, place, and time. She appears well-developed and well-nourished. No distress.  HENT:  Head: Normocephalic and atraumatic.  Right Ear: External ear normal.  Left Ear: External ear normal.  Nose: Nose normal.  Mouth/Throat: Oropharynx is clear and moist.  Eyes: Pupils are equal, round, and reactive to light.  Neck: Normal range of motion. Neck supple. No thyromegaly present.  Cardiovascular: Normal rate, regular rhythm, normal heart sounds and intact distal pulses.   No murmur heard. Pulmonary/Chest: Effort normal and breath sounds normal. No respiratory distress. She has no wheezes.  Abdominal: Soft. Bowel sounds are normal. She exhibits no distension. There is no tenderness.  Musculoskeletal: Normal range of motion. She  exhibits no edema or tenderness.  Neurological: She is alert and oriented to person, place, and time. She has normal reflexes. No cranial nerve deficit.  Skin: Skin is warm and dry.  Psychiatric: She has a normal mood and affect. Her behavior is normal. Judgment and thought content normal.  Vitals reviewed.     BP 166/106 mmHg  Pulse 86  Temp(Src) 97.5 F (36.4 C) (Oral)  Ht _0  (1.702 m)  Wt 295 lb (133.811 kg)  BMI 46.19 kg/m2     Assessment & Plan:  1. Essential hypertension, benign -Pt's Benazepril 5 mg d/c today and pt started on Zestoretic 20-12.4 mg today -Dash diet information given -Exercise encouraged - Stress Management  -Continue current meds -RTO in 2 weeks  - CMP14+EGFR - lisinopril-hydrochlorothiazide (ZESTORETIC) 20-12.5 MG tablet; Take 1 tablet by mouth daily.  Dispense: 90 tablet; Refill: 1  2. Hypothyroidism, unspecified hypothyroidism type - CMP14+EGFR - Thyroid Panel With TSH - levothyroxine (SYNTHROID, LEVOTHROID) 100 MCG tablet; Take 1 tablet (100 mcg total) by mouth daily.  Dispense: 90 tablet; Refill: 1  3. Hyperlipidemia LDL goal <100  - CMP14+EGFR - Lipid panel  4. Peripheral edema  - JXF36+VQOH  5. Metabolic syndrome - COB79+MTNZ - POCT glycosylated hemoglobin (Hb A1C)  6. Bilateral thoracic back pain -rest -Ice -Flexeril and Ultram ordered today - CMP14+EGFR - cyclobenzaprine (FLEXERIL) 10 MG tablet; Take 1 tablet (10 mg total) by mouth at bedtime.  Dispense: 45 tablet; Refill: 2 - traMADol (ULTRAM) 50 MG tablet; Take 1-2 tablets (50-100 mg total) by mouth every 8 (eight) hours as needed.  Dispense: 45 tablet; Refill: 0   Continue all meds Labs pending Health Maintenance reviewed Diet and exercise encouraged RTO 2 weeks to recheck BP  Evelina Dun, FNP

## 2015-05-20 NOTE — Patient Instructions (Signed)

## 2015-05-21 ENCOUNTER — Other Ambulatory Visit: Payer: Self-pay | Admitting: Family

## 2015-05-21 LAB — CMP14+EGFR
ALBUMIN: 3.9 g/dL (ref 3.6–4.8)
ALT: 19 IU/L (ref 0–32)
AST: 16 IU/L (ref 0–40)
Albumin/Globulin Ratio: 1.6 (ref 1.1–2.5)
Alkaline Phosphatase: 106 IU/L (ref 39–117)
BUN / CREAT RATIO: 21 (ref 11–26)
BUN: 16 mg/dL (ref 8–27)
Bilirubin Total: 0.5 mg/dL (ref 0.0–1.2)
CO2: 28 mmol/L (ref 18–29)
Calcium: 9.2 mg/dL (ref 8.7–10.3)
Chloride: 99 mmol/L (ref 97–106)
Creatinine, Ser: 0.76 mg/dL (ref 0.57–1.00)
GFR, EST AFRICAN AMERICAN: 97 mL/min/{1.73_m2} (ref >59)
GFR, EST NON AFRICAN AMERICAN: 84 mL/min/{1.73_m2} (ref >59)
GLUCOSE: 97 mg/dL (ref 65–99)
Globulin, Total: 2.4 g/dL (ref 1.5–4.5)
Potassium: 4.6 mmol/L (ref 3.5–5.2)
Sodium: 144 mmol/L (ref 136–144)
TOTAL PROTEIN: 6.3 g/dL (ref 6.0–8.5)

## 2015-05-21 LAB — LIPID PANEL
CHOL/HDL RATIO: 2.4 ratio (ref 0.0–4.4)
Cholesterol, Total: 128 mg/dL (ref 100–199)
HDL: 53 mg/dL (ref >39)
LDL CALC: 48 mg/dL (ref 0–99)
Triglycerides: 134 mg/dL (ref 0–149)
VLDL CHOLESTEROL CAL: 27 mg/dL (ref 5–40)

## 2015-05-21 LAB — THYROID PANEL WITH TSH
FREE THYROXINE INDEX: 1.7 (ref 1.2–4.9)
T3 Uptake Ratio: 25 % (ref 24–39)
T4, Total: 6.6 ug/dL (ref 4.5–12.0)
TSH: 5.18 u[IU]/mL — AB (ref 0.450–4.500)

## 2015-05-21 LAB — HEPATITIS C ANTIBODY: Hep C Virus Ab: <0.1 s/co ratio (ref 0.0–0.9)

## 2015-05-21 MED ORDER — LEVOTHYROXINE SODIUM 112 MCG PO TABS: 112.0000 ug | ORAL_TABLET | Freq: Every day | ORAL | Status: DC

## 2015-05-25 ENCOUNTER — Other Ambulatory Visit: Payer: Self-pay | Admitting: *Deleted

## 2015-05-25 ENCOUNTER — Telehealth: Payer: Self-pay | Admitting: Family

## 2015-05-25 NOTE — Telephone Encounter (Signed)
Patient was called .

## 2015-05-27 LAB — HM MAMMOGRAPHY

## 2015-05-31 ENCOUNTER — Encounter: Payer: Self-pay | Admitting: *Deleted

## 2015-06-10 ENCOUNTER — Ambulatory Visit: Payer: No Typology Code available for payment source | Admitting: Family

## 2015-06-13 ENCOUNTER — Ambulatory Visit: Payer: No Typology Code available for payment source | Admitting: Family

## 2015-06-14 ENCOUNTER — Encounter: Payer: Self-pay | Admitting: Family Medicine

## 2015-06-14 ENCOUNTER — Telehealth: Payer: Self-pay | Admitting: Family

## 2015-06-14 NOTE — Telephone Encounter (Signed)
Pt needs to keep chronic follow up appt

## 2015-07-07 ENCOUNTER — Ambulatory Visit (INDEPENDENT_AMBULATORY_CARE_PROVIDER_SITE_OTHER): Payer: No Typology Code available for payment source | Admitting: Pediatrics

## 2015-07-07 VITALS — BP 163/96 | HR 92 | Temp 97.7°F | Ht 67.0 in | Wt 297.2 lb

## 2015-07-07 DIAGNOSIS — N390 Urinary tract infection, site not specified: Secondary | ICD-10-CM | POA: Diagnosis not present

## 2015-07-07 DIAGNOSIS — I10 Essential (primary) hypertension: Secondary | ICD-10-CM | POA: Diagnosis not present

## 2015-07-07 DIAGNOSIS — R8281 Pyuria: Secondary | ICD-10-CM

## 2015-07-07 DIAGNOSIS — N019 Rapidly progressive nephritic syndrome with unspecified morphologic changes: Secondary | ICD-10-CM | POA: Diagnosis not present

## 2015-07-07 DIAGNOSIS — R399 Unspecified symptoms and signs involving the genitourinary system: Secondary | ICD-10-CM

## 2015-07-07 DIAGNOSIS — M546 Pain in thoracic spine: Secondary | ICD-10-CM

## 2015-07-07 DIAGNOSIS — N058 Unspecified nephritic syndrome with other morphologic changes: Secondary | ICD-10-CM | POA: Insufficient documentation

## 2015-07-07 DIAGNOSIS — R809 Proteinuria, unspecified: Secondary | ICD-10-CM | POA: Diagnosis not present

## 2015-07-07 LAB — POCT URINALYSIS DIPSTICK
BILIRUBIN UA: NEGATIVE
Glucose, UA: NEGATIVE
KETONES UA: NEGATIVE
Nitrite, UA: NEGATIVE
PH UA: 6
Spec Grav, UA: >=1.03
Urobilinogen, UA: NEGATIVE

## 2015-07-07 LAB — POCT UA - MICROSCOPIC ONLY
Casts, Ur, LPF, POC: NEGATIVE
Crystals, Ur, HPF, POC: NEGATIVE
MUCUS UA: NEGATIVE
YEAST UA: NEGATIVE

## 2015-07-07 MED ORDER — NITROFURANTOIN MONOHYD MACRO 100 MG PO CAPS: 100.0000 mg | ORAL_CAPSULE | Freq: Two times a day (BID) | ORAL | Status: DC

## 2015-07-07 MED ORDER — CYCLOBENZAPRINE HCL 10 MG PO TABS: 10.0000 mg | ORAL_TABLET | Freq: Every day | ORAL | Status: DC

## 2015-07-07 NOTE — Progress Notes (Signed)
Subjective:    Patient ID: Brianna Kelly, female    DOB: 26-Mar-1951, 64 y.o.   MRN: [id number]  CC: Back Pain and Burning urination  HPI: Brianna Kelly is a 64 y.o. female presenting for Back Pain and Burning urination  Burning with urination for the past week, started last Monday, feels like urination is different, leaking more, urinary urgency and frequency. L sided back pain  H/o necrotizing glomerulonephritis, was followed at [facility], has been on methotrexate, cytoxan, prednisone in the past but problem resolved and she was told to follow up with them as needed.  HTN: usually 130s-140s at home/80s No Headaches, vision changes   Depression screen [facility] 2/9 07/07/2015 04/18/2015 01/14/2015  Decreased Interest 0 0 0  Down, Depressed, Hopeless 0 0 0  PHQ - 2 Score 0 0 0     Relevant past medical, surgical, family and social history reviewed and updated as indicated. Interim medical history since our last visit reviewed. Allergies and medications reviewed and updated.    ROS: All systems negative other than what is in HPI  History  Smoking status  . Never Smoker   Smokeless tobacco  . Never Used    Past Medical History Patient Active Problem List   Diagnosis Date Noted  . 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 Peripheral edema 05/20/2015  . Metabolic syndrome [id number]  . History of arthroscopy of knee 07/29/2014  . Primary osteoarthritis of both knees 06/10/2014  . Hypothyroidism 01/26/2014  . Essential hypertension, benign 01/26/2014  . Hyperlipidemia LDL goal <100 01/26/2014  . Paresthesia 02/09/2013  . H/O total knee replacement 04/16/2011      Objective:    BP 163/96 mmHg  Pulse 92  Temp(Src) 97.7 F (36.5 C) (Oral)  Ht 5\' 7"  (1.702 m)  Wt 297 lb 3.2 oz (134.809 kg)  BMI 46.54 kg/m2  Wt Readings from Last 3 Encounters:  07/07/15 297 lb 3.2 oz (134.809 kg)  05/20/15 295 lb (133.811 kg)  04/18/15 292 lb 9.6 oz (132.722 kg)    Gen: NAD, alert,  cooperative with exam, NCAT EYES: EOMI, no scleral injection or icterus ENT:  MMM LYMPH: no cervical LAD CV: WWP Resp:  normal WOB Abd:  soft, NTND. L sided CVA tenderness Ext: No edema, warm Neuro: Alert and oriented     Assessment & Plan:    Shayna was seen today for UTI symptoms.  Diagnoses and all orders for this visit:  UTI symptoms Has slight pyuria. Will send for culture, start macrobid. Will call and have her stop if culture negative but starting into the weekend and pt with h/o kidney problems and currently having symptoms. -     POCT urinalysis dipstick -     POCT UA - Microscopic Only -     nitrofurantoin, macrocrystal-monohydrate, (MACROBID) 100 MG capsule; Take 1 capsule (100 mg total) by mouth 2 (two) times daily. 1 po BId -     Urine culture  Proteinuria Has had increasing protein in her urine on 3 UAs over past 2 years. Cr last 3 months ago was stable. Will check UPC ratio. Repeat UA in two weeks when not symptomatic, also should get BMP at that time. May need referral back to nephrology. -     Protein / Creatinine Ratio, Urine  Pyuria -     Urine culture  Bilateral thoracic back pain -     cyclobenzaprine (FLEXERIL) 10 MG tablet; Take 1 tablet (10 mg total) by mouth at bedtime.  Necrotizing glomerulonephritis  Has some   HTN continue HCTZ, lisinopril. Better controlled per pt at home, but still at times up to 140s. Will bring me numbers from home to visit in 2 weeks. May need increase in meds.   Follow up plan: Return in about 2 weeks (around 07/21/2015) for med follow up.  Assunta Found, MD [facility] 07/07/2015, 5:27 PM

## 2015-07-07 NOTE — Patient Instructions (Signed)
Check blood pressure every night at home

## 2015-07-08 LAB — PROTEIN / CREATININE RATIO, URINE
Creatinine, Urine: 171.3 mg/dL
PROTEIN UR: 80.7 mg/dL
PROTEIN/CREAT RATIO: 471 mg/g{creat} — AB (ref 0–200)

## 2015-07-10 LAB — URINE CULTURE

## 2015-07-11 ENCOUNTER — Telehealth: Payer: Self-pay | Admitting: Pediatrics

## 2015-07-11 NOTE — Telephone Encounter (Addendum)
I have no idea why she was called- do not know that anyone would have called her on a Sunday night from office. I do see where urine culture is back and macrobid is good coverage.

## 2015-07-11 NOTE — Telephone Encounter (Signed)
This patient says she received a call from [place] yesterday, can't find anything in chart. Did you call her?

## 2015-07-21 ENCOUNTER — Ambulatory Visit (INDEPENDENT_AMBULATORY_CARE_PROVIDER_SITE_OTHER): Payer: No Typology Code available for payment source | Admitting: Pediatrics

## 2015-07-21 ENCOUNTER — Encounter: Payer: Self-pay | Admitting: Pediatrics

## 2015-07-21 VITALS — BP 139/91 | HR 93 | Temp 97.0°F | Ht 67.0 in | Wt 297.0 lb

## 2015-07-21 DIAGNOSIS — I10 Essential (primary) hypertension: Secondary | ICD-10-CM

## 2015-07-21 DIAGNOSIS — N019 Rapidly progressive nephritic syndrome with unspecified morphologic changes: Secondary | ICD-10-CM | POA: Diagnosis not present

## 2015-07-21 DIAGNOSIS — R809 Proteinuria, unspecified: Secondary | ICD-10-CM | POA: Diagnosis not present

## 2015-07-21 DIAGNOSIS — R399 Unspecified symptoms and signs involving the genitourinary system: Secondary | ICD-10-CM

## 2015-07-21 DIAGNOSIS — N058 Unspecified nephritic syndrome with other morphologic changes: Secondary | ICD-10-CM

## 2015-07-21 LAB — POCT UA - MICROSCOPIC ONLY
CASTS, UR, LPF, POC: NEGATIVE
CRYSTALS, UR, HPF, POC: NEGATIVE
Yeast, UA: NEGATIVE

## 2015-07-21 LAB — POCT URINALYSIS DIPSTICK
BILIRUBIN UA: NEGATIVE
GLUCOSE UA: NEGATIVE
LEUKOCYTES UA: NEGATIVE
NITRITE UA: NEGATIVE
PH UA: 6
Spec Grav, UA: >=1.03
Urobilinogen, UA: NEGATIVE

## 2015-07-21 MED ORDER — BENAZEPRIL HCL 10 MG PO TABS: 10.0000 mg | ORAL_TABLET | Freq: Every day | ORAL | Status: DC

## 2015-07-21 NOTE — Progress Notes (Signed)
Subjective:    Patient ID: Brianna Kelly, female    DOB: December 31, 1950, 64 y.o.   MRN: [id number]  CC: Follow-up multiple medical problems  HPI: Brianna Kelly is a 64 y.o. female presenting for Follow-up  In the morning BP 120s/70s Up to 140s-150s/80s UTI symptoms much improved Occasionally will have some dysuria No fevers Back pain is gone Has noticed some bubbles in her urine intermittently No headaches or vision changes.  Depression screen [facility] 2/9 07/21/2015 07/07/2015 04/18/2015 01/14/2015  Decreased Interest 0 0 0 0  Down, Depressed, Hopeless 0 0 0 0  PHQ - 2 Score 0 0 0 0     Relevant past medical, surgical, family and social history reviewed and updated as indicated. Interim medical history since our last visit reviewed. Allergies and medications reviewed and updated.    ROS: Per HPI unless specifically indicated above  History  Smoking status  . Never Smoker   Smokeless tobacco  . Never Used    Past Medical History Patient Active Problem List   Diagnosis Date Noted  . Proteinuria 07/21/2015  . 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 Peripheral edema 05/20/2015  . Metabolic syndrome [id number]  . History of arthroscopy of knee 07/29/2014  . Primary osteoarthritis of both knees 06/10/2014  . Hypothyroidism 01/26/2014  . Essential hypertension, benign 01/26/2014  . Hyperlipidemia LDL goal <100 01/26/2014  . Paresthesia 02/09/2013  . H/O total knee replacement 04/16/2011    Current Outpatient Prescriptions  Medication Sig Dispense Refill  . clindamycin-benzoyl peroxide (BENZACLIN) gel     . clobetasol cream (TEMOVATE) 0.05 %     . cyclobenzaprine (FLEXERIL) 10 MG tablet Take 1 tablet (10 mg total) by mouth at bedtime. 30 tablet 0  . fluocinonide ointment (LIDEX) 0.05 %     . furosemide (LASIX) 20 MG tablet Take 1 tablet (20 mg total) by mouth daily. (Patient taking differently: Take 20 mg by mouth daily. Two pills every am, one at 6:00 pm) 30 tablet 5   . levothyroxine (SYNTHROID) 112 MCG tablet Take 1 tablet (112 mcg total) by mouth daily before breakfast. 90 tablet 1  . metroNIDAZOLE (METROGEL) 1 % gel     . nitrofurantoin, macrocrystal-monohydrate, (MACROBID) 100 MG capsule Take 1 capsule (100 mg total) by mouth 2 (two) times daily. 1 po BId 10 capsule 0  . rosuvastatin (CRESTOR) 20 MG tablet Take 1 tablet (20 mg total) by mouth daily. 90 tablet 3  . traMADol (ULTRAM) 50 MG tablet Take 1-2 tablets (50-100 mg total) by mouth every 8 (eight) hours as needed. 45 tablet 0  . benazepril (LOTENSIN) 10 MG tablet Take 1 tablet (10 mg total) by mouth daily. 30 tablet 3   No current facility-administered medications for this visit.        Objective:    BP 139/91 mmHg  Pulse 93  Temp(Src) 97 F (36.1 C) (Oral)  Ht 5\' 7"  (1.702 m)  Wt 297 lb (134.718 kg)  BMI 46.51 kg/m2  Wt Readings from Last 3 Encounters:  07/21/15 297 lb (134.718 kg)  07/07/15 297 lb 3.2 oz (134.809 kg)  05/20/15 295 lb (133.811 kg)     Gen: NAD, alert, cooperative with exam, NCAT EYES: EOMI, no scleral injection or icterus ENT:  TMs pearly gray b/l, OP without erythema LYMPH: no cervical LAD CV: NRRR, normal S1/S2, no murmur, distal pulses 2+ b/l Resp: CTABL, no wheezes, normal WOB Abd: +BS, soft, NTND. no guarding or organomegaly, no CVA tenderness Ext: No edema, warm  Neuro: Alert and oriented     Assessment & Plan:    Najay was seen today for follow-up blood pressure, proteinuria, UTi symptoms.  Diagnoses and all orders for this visit:  Essential hypertension, benign Improved control. Will increase to benzapril 10mg , was taking 5mg . Was NOT on lisinopril/HCTZ combination medicine. -     benazepril (LOTENSIN) 10 MG tablet; Take 1 tablet (10 mg total) by mouth daily.  UTI symptoms Recently treated with nitrofurantoin for UTI. Symptoms much improved but still present. Will recheck UA/culture. 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 Was followed regularly by nephrology at [facility] but as symptoms had been stable now following here for regular urine protein and Cr checks. UPC still with mild proteinuria. Will continue to check at regular intervals.  Necrotizing glomerulonephritis Cr has been stable. Will check next visit.   Follow up plan: Return in about 2 months (around 09/20/2015). Due for BMP, serum Albumin, UPC, UA at that time. Also f/u BPs from home.  Assunta Found, MD [facility] 07/21/2015, 5:33 PM

## 2015-07-23 LAB — URINE CULTURE

## 2015-07-26 ENCOUNTER — Telehealth: Payer: Self-pay | Admitting: Pediatrics

## 2015-07-26 NOTE — Telephone Encounter (Signed)
Patient is experiencing chills, fever, body aches, runny nose and nasal congestion, all since Sunday. She said she does not feel well enough to come in.  Wants to know if we can send something to [facility].

## 2015-07-26 NOTE — Telephone Encounter (Signed)
Patient aware of provider's advise.

## 2015-07-26 NOTE — Telephone Encounter (Signed)
nneds to have flu swab for flu- how high is fever and what other symptoms does she have?

## 2015-07-26 NOTE — Telephone Encounter (Signed)
Has had to long for tamiflu to help- after 48 hours tamiflu will do no good

## 2015-08-02 ENCOUNTER — Other Ambulatory Visit: Payer: Self-pay | Admitting: Family

## 2015-09-28 ENCOUNTER — Other Ambulatory Visit: Payer: Self-pay | Admitting: Family Medicine

## 2015-10-19 ENCOUNTER — Ambulatory Visit: Payer: No Typology Code available for payment source | Admitting: Pediatrics

## 2015-11-21 ENCOUNTER — Telehealth: Payer: Self-pay | Admitting: Pediatrics

## 2015-12-01 NOTE — Telephone Encounter (Signed)
Patient stated she came office and talked with someone about her record.

## 2016-03-07 IMAGING — CR DG LUMBAR SPINE COMPLETE 4+V
2 series · 2 of 2 positions shown · non-contrast
Comparison: None.

CLINICAL DATA: Low back pain.

EXAM:
LUMBAR SPINE - COMPLETE 4+ VIEW

[view not recorded (1 of 2)]
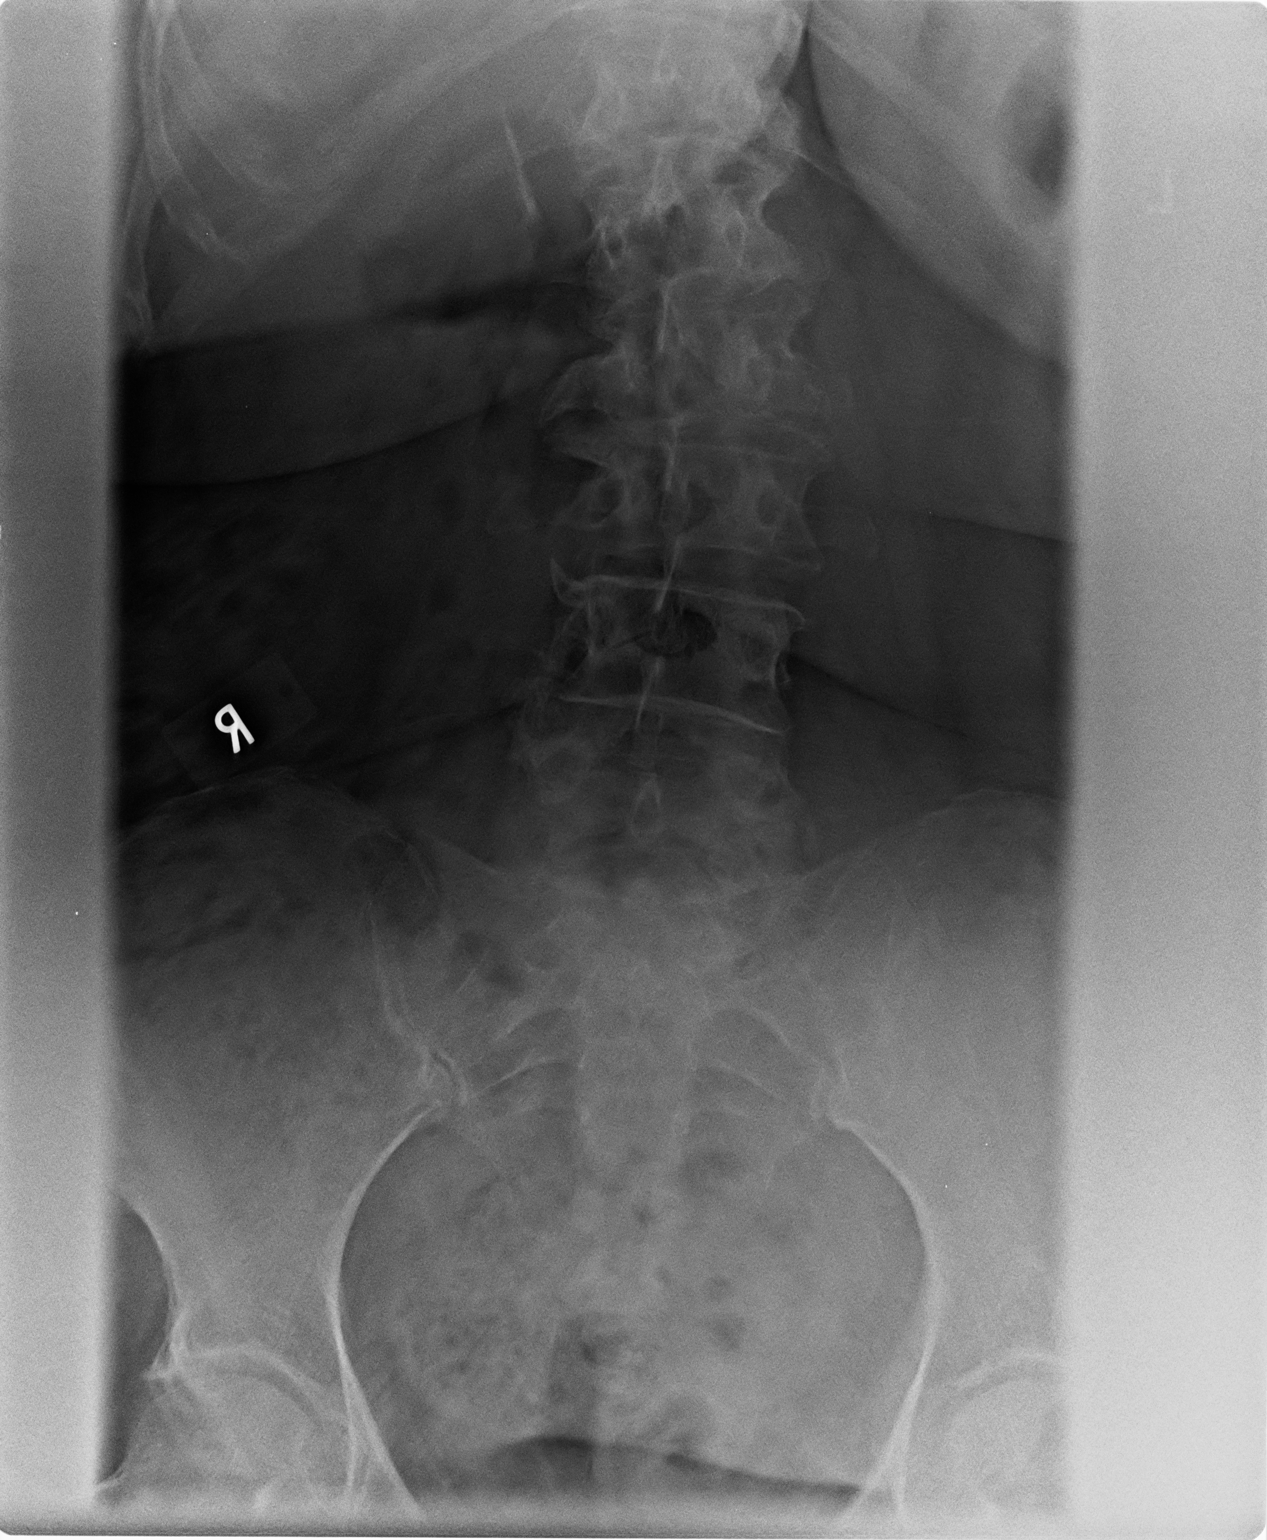

[view not recorded (2 of 2)]
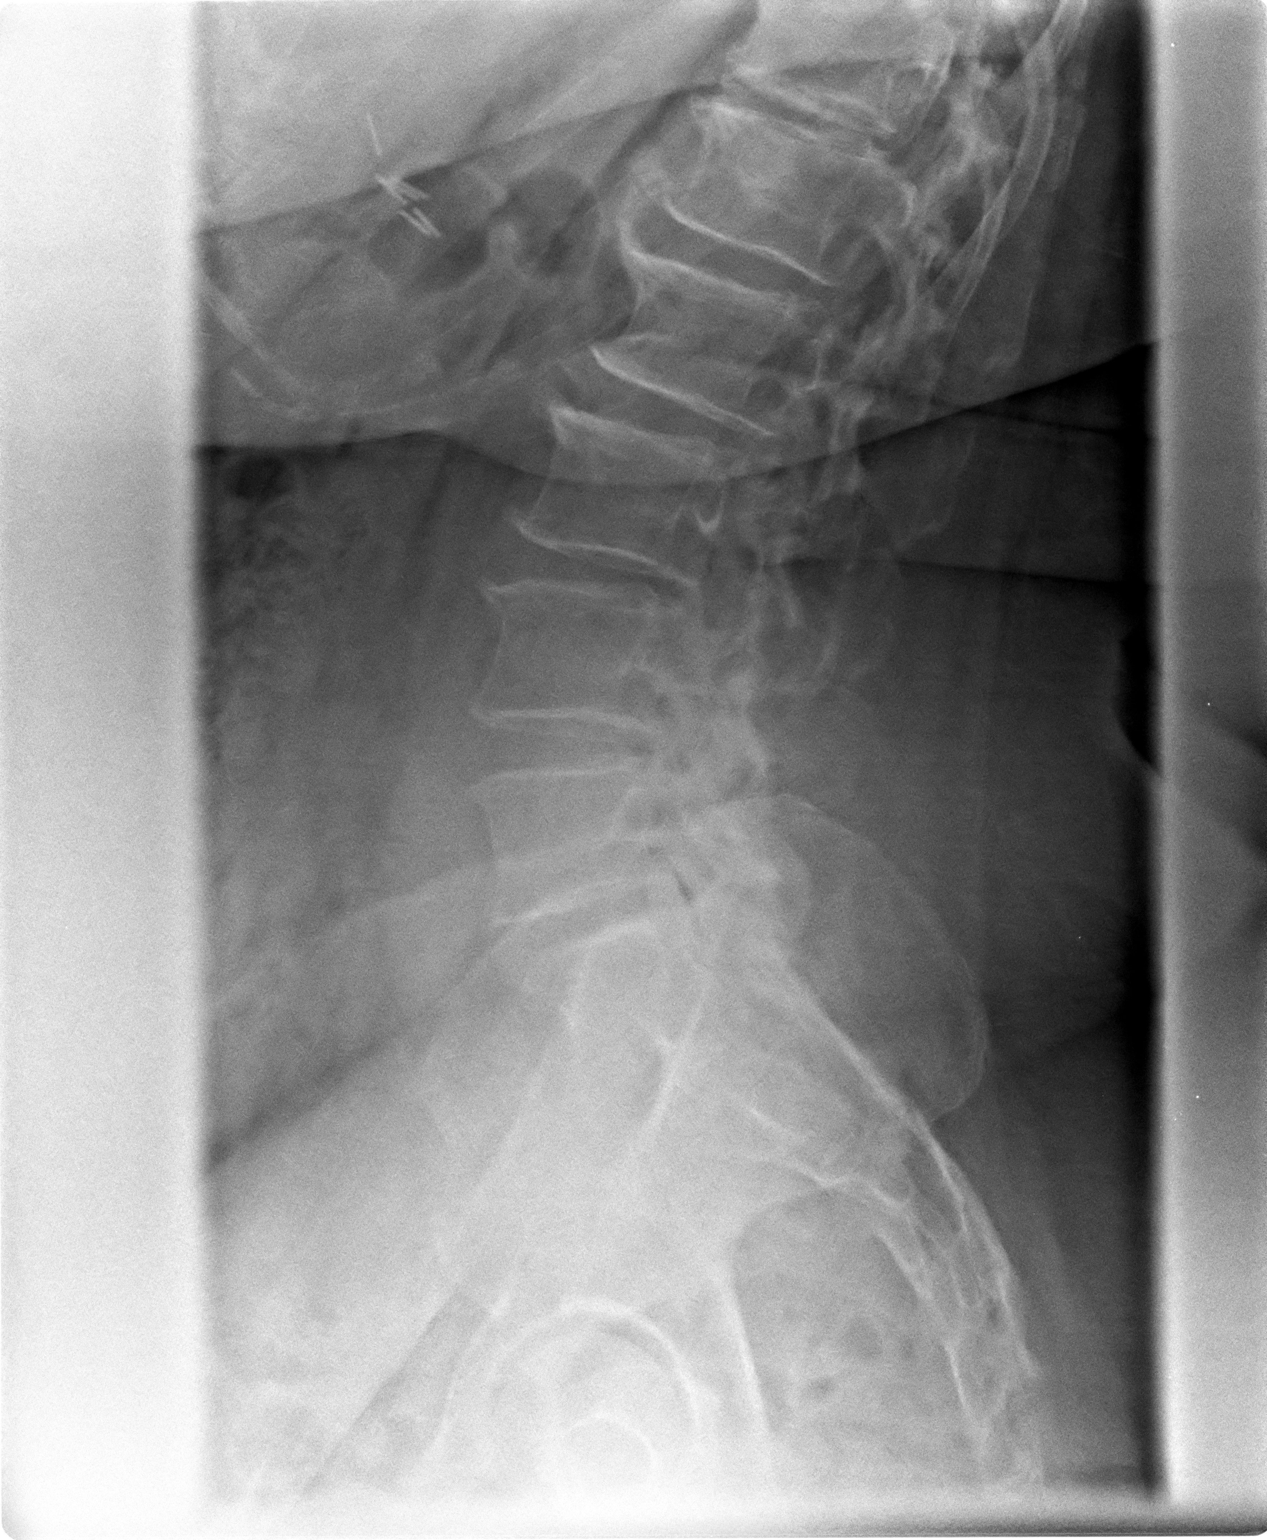

[2 of 2 positions shown; findings below may reference images not displayed]

FINDINGS: No fracture.  No spondylolisthesis.

There is a mild levoscoliosis with the apex at L2-L3.

Mild loss of disc height is noted at L3-L4 and L5-S1. There are
endplate osteophytes throughout the visualized spine.

Bones are demineralized.

Soft tissues show changes from cholecystectomy but are otherwise
unremarkable.
IMPRESSION: 1. No fracture or spondylolisthesis.
2. Degenerative changes as detailed.

## 2016-03-21 ENCOUNTER — Telehealth: Payer: Self-pay | Admitting: Physician Assistant

## 2016-03-21 DIAGNOSIS — I10 Essential (primary) hypertension: Secondary | ICD-10-CM

## 2016-03-21 MED ORDER — BENAZEPRIL HCL 10 MG PO TABS: 10.0000 mg | ORAL_TABLET | Freq: Every day | ORAL | 6 refills | Status: DC

## 2016-03-21 MED ORDER — FUROSEMIDE 20 MG PO TABS: 20.0000 mg | ORAL_TABLET | Freq: Every day | ORAL | 6 refills | Status: DC

## 2016-03-21 NOTE — Telephone Encounter (Signed)
Patient needs refills on furosemide 20mg  1 QD and benazepril 10 mg 1 QD. Patient has appointment with you in November. Please advise and send back to the pools.

## 2016-03-22 ENCOUNTER — Other Ambulatory Visit: Payer: Self-pay | Admitting: *Deleted

## 2016-03-27 ENCOUNTER — Telehealth: Payer: Self-pay | Admitting: Physician Assistant

## 2016-03-27 DIAGNOSIS — I10 Essential (primary) hypertension: Secondary | ICD-10-CM

## 2016-03-27 MED ORDER — FUROSEMIDE 20 MG PO TABS: 60.0000 mg | ORAL_TABLET | Freq: Every day | ORAL | 6 refills | Status: DC

## 2016-03-27 NOTE — Telephone Encounter (Signed)
Prescription sent to pharmacy.

## 2016-05-01 ENCOUNTER — Encounter: Payer: Self-pay | Admitting: Physician Assistant

## 2016-05-01 ENCOUNTER — Ambulatory Visit (INDEPENDENT_AMBULATORY_CARE_PROVIDER_SITE_OTHER): Payer: No Typology Code available for payment source | Admitting: Physician Assistant

## 2016-05-01 VITALS — BP 180/103 | HR 77 | Temp 96.8°F | Ht 67.0 in | Wt 297.0 lb

## 2016-05-01 DIAGNOSIS — R809 Proteinuria, unspecified: Secondary | ICD-10-CM

## 2016-05-01 DIAGNOSIS — Z01419 Encounter for gynecological examination (general) (routine) without abnormal findings: Secondary | ICD-10-CM

## 2016-05-01 DIAGNOSIS — E8881 Metabolic syndrome: Secondary | ICD-10-CM

## 2016-05-01 DIAGNOSIS — I10 Essential (primary) hypertension: Secondary | ICD-10-CM

## 2016-05-01 DIAGNOSIS — E039 Hypothyroidism, unspecified: Secondary | ICD-10-CM

## 2016-05-01 DIAGNOSIS — E785 Hyperlipidemia, unspecified: Secondary | ICD-10-CM

## 2016-05-01 DIAGNOSIS — N058 Unspecified nephritic syndrome with other morphologic changes: Secondary | ICD-10-CM

## 2016-05-01 MED ORDER — HYDROCODONE-HOMATROPINE 5-1.5 MG/5ML PO SYRP: 5.0000 mL | ORAL_SOLUTION | Freq: Four times a day (QID) | ORAL | 0 refills | Status: DC | PRN

## 2016-05-01 MED ORDER — ALBUTEROL SULFATE HFA 108 (90 BASE) MCG/ACT IN AERS: 2.0000 | INHALATION_SPRAY | Freq: Four times a day (QID) | RESPIRATORY_TRACT | 0 refills | Status: DC | PRN

## 2016-05-01 MED ORDER — AZITHROMYCIN 250 MG PO TABS: ORAL_TABLET | ORAL | 0 refills | Status: DC

## 2016-05-01 NOTE — Progress Notes (Signed)
BP (!) 180/103   Pulse 77   Temp (!) 96.8 F (36 C) (Oral)   Ht _0  (1.702 m)   Wt 297 lb (134.7 kg)   BMI 46.52 kg/m    Subjective:    Patient ID: Brianna Kelly, female    DOB: 1951/06/03, 65 y.o.   MRN: [id number]  Brianna Kelly is a 65 y.o. female presenting on 05/01/2016 for Annual Exam (Pap ); Medication Refill; Nasal Congestion (Had Flu shot 10/4 and has felt bad ever since ); Cough; and Generalized Body Aches   HPI Patient here to be established as new patient at [facility].  This patient is known to me from [facility]. This patient comes in for her annual female exam. She will have a Pap and pelvic also performed today. She is also concomitantly sick with bronchitis. She has been sick for over a week now. She has significant cough and production. She denies any significant color to the mucus. She denies any blood. She does have some wheezing or shortness of breath with moving. She has significant cough and she lays down at night. All of her medications are also reviewed today and will have refills. She will get her labs updated today also.  She has significant medical history for essential hypertension, hypothyroidism, necrotizing glomerulonephritis, hyperlipidemia, metabolic syndrome, proteinuria, morbid obesity.   Relevant past medical, surgical, family and social history reviewed and updated as indicated. Interim medical history since our last visit reviewed. Allergies and medications reviewed and updated.   Data reviewed from any sources in EPIC.  Review of Systems  Constitutional: Negative.   HENT: Positive for congestion, postnasal drip and sore throat.   Eyes: Negative.   Respiratory: Positive for cough and wheezing.   Gastrointestinal: Negative.   Genitourinary: Negative.     Per HPI unless specifically indicated above  Social History   Social History  . Marital status: Married    Spouse name: Lorayne Marek "Richardson Landry"  . Number  of children: 2  . Years of education: AA   Occupational History  .  [place] History Main Topics  . Smoking status: Never Smoker  . Smokeless tobacco: Never Used  . Alcohol use No  . Drug use: No  . Sexual activity: Not on file   Other Topics Concern  . Not on file   Social History Narrative   Pt lives at home with spouse.   Caffeine Use: Very little    Past Surgical History:  Procedure Laterality Date  . CHOLECYSTECTOMY  2001  . REPLACEMENT TOTAL KNEE Left 2011  . TUBAL LIGATION  1976    Family History  Problem Relation Age of Onset  . Obesity Father     died having surgery to remove gallbladder  . Diabetes Father   . Cancer Sister     ovarian  . Lung disease Mother   . Anxiety disorder Mother   . Colon cancer Neg Hx       Medication List       Accurate as of 05/01/16  9:25 PM. Always use your most recent med list.          albuterol 108 (90 Base) MCG/ACT inhaler Commonly known as:  PROVENTIL HFA;VENTOLIN HFA Inhale 2 puffs into the lungs every 6 (six) hours as needed for wheezing or shortness of breath.   azithromycin 250 MG tablet Commonly known as:  ZITHROMAX Z-PAK As directed   benazepril  10 MG tablet Commonly known as:  LOTENSIN Take 1 tablet (10 mg total) by mouth daily.   clindamycin-benzoyl peroxide gel Commonly known as:  BENZACLIN   clobetasol cream 0.05 % Commonly known as:  TEMOVATE   cyclobenzaprine 10 MG tablet Commonly known as:  FLEXERIL Take 1 tablet (10 mg total) by mouth at bedtime.   fluocinonide ointment 0.05 % Commonly known as:  LIDEX   furosemide 20 MG tablet Commonly known as:  LASIX Take 3 tablets (60 mg total) by mouth daily. (Two pills every am, one at 6:00 pm)   HYDROcodone-homatropine 5-1.5 MG/5ML syrup Commonly known as:  HYCODAN Take 5-10 mLs by mouth every 6 (six) hours as needed for cough.   levothyroxine 112 MCG tablet Commonly known as:  SYNTHROID, LEVOTHROID TAKE ONE TABLET BY  MOUTH ONCE DAILY BEFORE BREAKFAST   rosuvastatin 20 MG tablet Commonly known as:  CRESTOR Take 1 tablet (20 mg total) by mouth daily.   traMADol 50 MG tablet Commonly known as:  ULTRAM Take 1-2 tablets (50-100 mg total) by mouth every 8 (eight) hours as needed.          Objective:    BP (!) 180/103   Pulse 77   Temp (!) 96.8 F (36 C) (Oral)   Ht _0  (1.702 m)   Wt 297 lb (134.7 kg)   BMI 46.52 kg/m   Allergies  Allergen Reactions  . Codeine     itching   Wt Readings from Last 3 Encounters:  05/01/16 297 lb (134.7 kg)  07/21/15 297 lb (134.7 kg)  07/07/15 297 lb 3.2 oz (134.8 kg)    Physical Exam  Constitutional: She is oriented to person, place, and time. She appears well-developed and well-nourished.  HENT:  Head: Normocephalic and atraumatic.  Right Ear: Tympanic membrane and external ear normal. No middle ear effusion.  Left Ear: Tympanic membrane and external ear normal.  No middle ear effusion.  Nose: Mucosal edema and rhinorrhea present. Right sinus exhibits no maxillary sinus tenderness. Left sinus exhibits no maxillary sinus tenderness.  Mouth/Throat: Uvula is midline. Posterior oropharyngeal erythema present.  Eyes: Conjunctivae and EOM are normal. Pupils are equal, round, and reactive to light. Right eye exhibits no discharge. Left eye exhibits no discharge.  Neck: Normal range of motion. Neck supple.  Cardiovascular: Normal rate, regular rhythm, normal heart sounds and intact distal pulses.   Pulmonary/Chest: Effort normal. No respiratory distress. She has wheezes in the right upper field and the left upper field.  Abdominal: Soft. Bowel sounds are normal.  Genitourinary: Vagina normal and uterus normal. No breast swelling, tenderness or discharge. Pelvic exam was performed with patient supine. There is no rash on the right labia. There is no tenderness on the left labia. Cervix exhibits no motion tenderness. Right adnexum displays no mass, no tenderness  and no fullness. Left adnexum displays no mass, no tenderness and no fullness.  Lymphadenopathy:    She has no cervical adenopathy.  Neurological: She is alert and oriented to person, place, and time. She has normal reflexes.  Skin: Skin is warm and dry. No rash noted.  Psychiatric: She has a normal mood and affect. Her behavior is normal. Judgment and thought content normal.        Assessment & Plan:   1. Well female exam with routine gynecological exam - CBC with Differential/Platelet - CMP14+EGFR - Lipid panel - TSH - Pap IG w/ reflex to HPV when ASC-U  2. Essential hypertension, benign - CMP14+EGFR -  Microalbumin / creatinine urine ratio Benazepril 10 mg 1 daily Furosemide 20 mg 3 daily  3. Hypothyroidism, unspecified type - TSH Levothyroxine 112 g 1 daily  4. Necrotizing glomerulonephritis - CBC with Differential/Platelet - CMP14+EGFR - Microalbumin / creatinine urine ratio  5. Hyperlipidemia LDL goal <100 Rosuvastatin 20 mg 1 daily - Lipid panel  6. Metabolic syndrome  7. Proteinuria, unspecified type - Microalbumin / creatinine urine ratio  8. Morbid obesity ([place]) DASH diet information given  9. Bronchitis  Zithromax pack #1 take as directed Albuterol metered-dose inhaler 2 puffs 4 times a day for cough and wheeze Hycodan syrup 5-10 MLS 4 times a day for cough and congestion  Continue all other maintenance medications as listed above. Educational handout given for DASH diet  Follow up plan: Return in about 3 months (around 08/01/2016), or if symptoms worsen or fail to improve.  Terald Sleeper PA-C [place] [address]  [place], [place] [address] [phone]   05/01/2016, 9:25 PM

## 2016-05-01 NOTE — Patient Instructions (Signed)
DASH Eating Plan  DASH stands for "Dietary Approaches to Stop Hypertension." The DASH eating plan is a healthy eating plan that has been shown to reduce high blood pressure (hypertension). Additional health benefits may include reducing the risk of type 2 diabetes mellitus, heart disease, and stroke. The DASH eating plan may also help with weight loss.  WHAT DO I NEED TO KNOW ABOUT THE DASH EATING PLAN?  For the DASH eating plan, you will follow these general guidelines:  · Choose foods with a percent daily value for sodium of less than 5% (as listed on the food label).  · Use salt-free seasonings or herbs instead of table salt or sea salt.  · Check with your health care provider or pharmacist before using salt substitutes.  · Eat lower-sodium products, often labeled as "lower sodium" or "no salt added."  · Eat fresh foods.  · Eat more vegetables, fruits, and low-fat dairy products.  · Choose whole grains. Look for the word "whole" as the first word in the ingredient list.  · Choose fish and skinless chicken or turkey more often than red meat. Limit fish, poultry, and meat to 6 oz (170 g) each day.  · Limit sweets, desserts, sugars, and sugary drinks.  · Choose heart-healthy fats.  · Limit cheese to 1 oz (28 g) per day.  · Eat more home-cooked food and less restaurant, buffet, and fast food.  · Limit fried foods.  · Cook foods using methods other than frying.  · Limit canned vegetables. If you do use them, rinse them well to decrease the sodium.  · When eating at a restaurant, ask that your food be prepared with less salt, or no salt if possible.  WHAT FOODS CAN I EAT?  Seek help from a dietitian for individual calorie needs.  Grains  Whole grain or whole wheat bread. Brown rice. Whole grain or whole wheat pasta. Quinoa, bulgur, and whole grain cereals. Low-sodium cereals. Corn or whole wheat flour tortillas. Whole grain cornbread. Whole grain crackers. Low-sodium crackers.  Vegetables  Fresh or frozen vegetables  (raw, steamed, roasted, or grilled). Low-sodium or reduced-sodium tomato and vegetable juices. Low-sodium or reduced-sodium tomato sauce and paste. Low-sodium or reduced-sodium canned vegetables.   Fruits  All fresh, canned (in natural juice), or frozen fruits.  Meat and Other Protein Products  Ground beef (85% or leaner), grass-fed beef, or beef trimmed of fat. Skinless chicken or turkey. Ground chicken or turkey. Pork trimmed of fat. All fish and seafood. Eggs. Dried beans, peas, or lentils. Unsalted nuts and seeds. Unsalted canned beans.  Dairy  Low-fat dairy products, such as skim or 1% milk, 2% or reduced-fat cheeses, low-fat ricotta or cottage cheese, or plain low-fat yogurt. Low-sodium or reduced-sodium cheeses.  Fats and Oils  Tub margarines without trans fats. Light or reduced-fat mayonnaise and salad dressings (reduced sodium). Avocado. Safflower, olive, or canola oils. Natural peanut or almond butter.  Other  Unsalted popcorn and pretzels.  The items listed above may not be a complete list of recommended foods or beverages. Contact your dietitian for more options.  WHAT FOODS ARE NOT RECOMMENDED?  Grains  White bread. White pasta. White rice. Refined cornbread. Bagels and croissants. Crackers that contain trans fat.  Vegetables  Creamed or fried vegetables. Vegetables in a cheese sauce. Regular canned vegetables. Regular canned tomato sauce and paste. Regular tomato and vegetable juices.  Fruits  Dried fruits. Canned fruit in light or heavy syrup. Fruit juice.  Meat and Other Protein   Products  Fatty cuts of meat. Ribs, chicken wings, bacon, sausage, bologna, salami, chitterlings, fatback, hot dogs, bratwurst, and packaged luncheon meats. Salted nuts and seeds. Canned beans with salt.  Dairy  Whole or 2% milk, cream, half-and-half, and cream cheese. Whole-fat or sweetened yogurt. Full-fat cheeses or blue cheese. Nondairy creamers and whipped toppings. Processed cheese, cheese spreads, or cheese  curds.  Condiments  Onion and garlic salt, seasoned salt, table salt, and sea salt. Canned and packaged gravies. Worcestershire sauce. Tartar sauce. Barbecue sauce. Teriyaki sauce. Soy sauce, including reduced sodium. Steak sauce. Fish sauce. Oyster sauce. Cocktail sauce. Horseradish. Ketchup and mustard. Meat flavorings and tenderizers. Bouillon cubes. Hot sauce. Tabasco sauce. Marinades. Taco seasonings. Relishes.  Fats and Oils  Butter, stick margarine, lard, shortening, ghee, and bacon fat. Coconut, palm kernel, or palm oils. Regular salad dressings.  Other  Pickles and olives. Salted popcorn and pretzels.  The items listed above may not be a complete list of foods and beverages to avoid. Contact your dietitian for more information.  WHERE CAN I FIND MORE INFORMATION?  National Heart, Lung, and Blood Institute: www.nhlbi.nih.gov/health/health-topics/topics/dash/     This information is not intended to replace advice given to you by your health care provider. Make sure you discuss any questions you have with your health care provider.     Document Released: 06/28/2011 Document Revised: 07/30/2014 Document Reviewed: 05/13/2013  Elsevier Interactive Patient Education ©2016 Elsevier Inc.

## 2016-05-02 LAB — MICROALBUMIN / CREATININE URINE RATIO
CREATININE, UR: 74.1 mg/dL
MICROALBUM., U, RANDOM: 285 ug/mL
Microalb/Creat Ratio: 384.6 mg/g creat — ABNORMAL HIGH (ref 0.0–30.0)

## 2016-05-03 LAB — CMP14+EGFR
ALBUMIN: 3.8 g/dL (ref 3.6–4.8)
ALK PHOS: 116 IU/L (ref 39–117)
ALT: 30 IU/L (ref 0–32)
AST: 20 IU/L (ref 0–40)
Albumin/Globulin Ratio: 1.6 (ref 1.2–2.2)
BILIRUBIN TOTAL: 0.3 mg/dL (ref 0.0–1.2)
BUN/Creatinine Ratio: 18 (ref 12–28)
BUN: 14 mg/dL (ref 8–27)
CHLORIDE: 100 mmol/L (ref 96–106)
CO2: 26 mmol/L (ref 18–29)
CREATININE: 0.76 mg/dL (ref 0.57–1.00)
Calcium: 8.7 mg/dL (ref 8.7–10.3)
GFR calc Af Amer: 96 mL/min/{1.73_m2} (ref >59)
GFR calc non Af Amer: 83 mL/min/{1.73_m2} (ref >59)
GLUCOSE: 82 mg/dL (ref 65–99)
Globulin, Total: 2.4 g/dL (ref 1.5–4.5)
Potassium: 4.2 mmol/L (ref 3.5–5.2)
Sodium: 142 mmol/L (ref 134–144)
Total Protein: 6.2 g/dL (ref 6.0–8.5)

## 2016-05-03 LAB — CBC WITH DIFFERENTIAL/PLATELET
BASOS ABS: 0 10*3/uL (ref 0.0–0.2)
BASOS: 0 %
EOS (ABSOLUTE): 0.2 10*3/uL (ref 0.0–0.4)
Eos: 1 %
HEMOGLOBIN: 14.2 g/dL (ref 11.1–15.9)
Hematocrit: 43 % (ref 34.0–46.6)
IMMATURE GRANS (ABS): 0.1 10*3/uL (ref 0.0–0.1)
IMMATURE GRANULOCYTES: 1 %
LYMPHS: 33 %
Lymphocytes Absolute: 4.4 10*3/uL — ABNORMAL HIGH (ref 0.7–3.1)
MCH: 28.7 pg (ref 26.6–33.0)
MCHC: 33 g/dL (ref 31.5–35.7)
MCV: 87 fL (ref 79–97)
MONOCYTES: 7 %
Monocytes Absolute: 1 10*3/uL — ABNORMAL HIGH (ref 0.1–0.9)
NEUTROS ABS: 7.6 10*3/uL — AB (ref 1.4–7.0)
NEUTROS PCT: 58 %
PLATELETS: 324 10*3/uL (ref 150–379)
RBC: 4.95 x10E6/uL (ref 3.77–5.28)
RDW: 14.8 % (ref 12.3–15.4)
WBC: 13.3 10*3/uL — ABNORMAL HIGH (ref 3.4–10.8)

## 2016-05-03 LAB — LIPID PANEL
CHOLESTEROL TOTAL: 146 mg/dL (ref 100–199)
Chol/HDL Ratio: 3.5 ratio units (ref 0.0–4.4)
HDL: 42 mg/dL (ref >39)
LDL CALC: 49 mg/dL (ref 0–99)
TRIGLYCERIDES: 277 mg/dL — AB (ref 0–149)
VLDL CHOLESTEROL CAL: 55 mg/dL — AB (ref 5–40)

## 2016-05-03 LAB — TSH: TSH: 0.135 u[IU]/mL — ABNORMAL LOW (ref 0.450–4.500)

## 2016-05-04 LAB — PAP IG W/ RFLX HPV ASCU: PAP Smear Comment: 0

## 2016-05-07 MED ORDER — LEVOTHYROXINE SODIUM 100 MCG PO TABS: 100.0000 ug | ORAL_TABLET | Freq: Every day | ORAL | 1 refills | Status: DC

## 2016-05-07 NOTE — Addendum Note (Signed)
Addended by: Thana Ates on: 05/07/2016 08:40 AM   Modules accepted: Orders

## 2016-05-16 ENCOUNTER — Ambulatory Visit (INDEPENDENT_AMBULATORY_CARE_PROVIDER_SITE_OTHER): Payer: No Typology Code available for payment source | Admitting: Physician Assistant

## 2016-05-16 ENCOUNTER — Encounter: Payer: Self-pay | Admitting: Physician Assistant

## 2016-05-16 VITALS — BP 142/87 | HR 78 | Temp 97.2°F | Ht 67.0 in | Wt 298.0 lb

## 2016-05-16 DIAGNOSIS — J209 Acute bronchitis, unspecified: Secondary | ICD-10-CM | POA: Diagnosis not present

## 2016-05-16 MED ORDER — CEFDINIR 300 MG PO CAPS: 300.0000 mg | ORAL_CAPSULE | Freq: Two times a day (BID) | ORAL | 0 refills | Status: DC

## 2016-05-16 MED ORDER — HYDROCODONE-HOMATROPINE 5-1.5 MG/5ML PO SYRP: 5.0000 mL | ORAL_SOLUTION | Freq: Four times a day (QID) | ORAL | 0 refills | Status: DC | PRN

## 2016-05-16 MED ORDER — PREDNISONE 10 MG (21) PO TBPK: ORAL_TABLET | ORAL | 1 refills | Status: DC

## 2016-05-16 NOTE — Patient Instructions (Signed)

## 2016-05-16 NOTE — Progress Notes (Signed)
BP (!) 142/87   Pulse 78   Temp 97.2 F (36.2 C) (Oral)   Ht 5\' 7"  (1.702 m)   Wt 298 lb (135.2 kg)   BMI 46.67 kg/m    Subjective:    Patient ID: Brianna Kelly, female    DOB: Jun 23, 1951, 65 y.o.   MRN: [id number]  HPI: Brianna Kelly is a 65 y.o. female presenting on 05/16/2016 for Generalized Body Aches (For 3 days)  Had been sick earlier in the month and resolved. In the past 4-5 days has had increased headache, cough, congestion, body aching, productive cough without blood. Denies NVD.  Past Medical History:  Diagnosis Date  . Hyperlipidemia   . Hypertension   . Kidney failure 2009    kidney function  stable now  . Thyroid disease   . Vitiligo    Relevant past medical, surgical, family and social history reviewed and updated as indicated. Interim medical history since our last visit reviewed. Allergies and medications reviewed and updated. DATA REVIEWED: CHART IN EPIC  Social History   Social History  . Marital status: Married    Spouse name: Lorayne Marek "Richardson Landry"  . Number of children: 2  . Years of education: AA   Occupational History  .  [place] History Main Topics  . Smoking status: Never Smoker  . Smokeless tobacco: Never Used  . Alcohol use No  . Drug use: No  . Sexual activity: Not on file   Other Topics Concern  . Not on file   Social History Narrative   Pt lives at home with spouse.   Caffeine Use: Very little    Past Surgical History:  Procedure Laterality Date  . CHOLECYSTECTOMY  2001  . REPLACEMENT TOTAL KNEE Left 2011  . TUBAL LIGATION  1976    Family History  Problem Relation Age of Onset  . Obesity Father     died having surgery to remove gallbladder  . Diabetes Father   . Cancer Sister     ovarian  . Lung disease Mother   . Anxiety disorder Mother   . Colon cancer Neg Hx     Review of Systems  Constitutional: Negative.   HENT: Positive for congestion, postnasal drip and sore throat.   Eyes: Negative.     Respiratory: Positive for cough, shortness of breath and wheezing.   Cardiovascular: Negative for chest pain and palpitations.  Gastrointestinal: Negative.   Genitourinary: Negative.       Medication List       Accurate as of 05/16/16  5:22 PM. Always use your most recent med list.          albuterol 108 (90 Base) MCG/ACT inhaler Commonly known as:  PROVENTIL HFA;VENTOLIN HFA Inhale 2 puffs into the lungs every 6 (six) hours as needed for wheezing or shortness of breath.   benazepril 10 MG tablet Commonly known as:  LOTENSIN Take 1 tablet (10 mg total) by mouth daily.   cefdinir 300 MG capsule Commonly known as:  OMNICEF Take 1 capsule (300 mg total) by mouth 2 (two) times daily. 1 po BID   clindamycin-benzoyl peroxide gel Commonly known as:  BENZACLIN   clobetasol cream 0.05 % Commonly known as:  TEMOVATE   cyclobenzaprine 10 MG tablet Commonly known as:  FLEXERIL Take 1 tablet (10 mg total) by mouth at bedtime.   fluocinonide ointment 0.05 % Commonly known as:  LIDEX   furosemide 20 MG tablet Commonly known  as:  LASIX Take 3 tablets (60 mg total) by mouth daily. (Two pills every am, one at 6:00 pm)   HYDROcodone-homatropine 5-1.5 MG/5ML syrup Commonly known as:  HYCODAN Take 5-10 mLs by mouth every 6 (six) hours as needed for cough.   levothyroxine 100 MCG tablet Commonly known as:  SYNTHROID, LEVOTHROID Take 1 tablet (100 mcg total) by mouth daily.   predniSONE 10 MG (21) Tbpk tablet Commonly known as:  STERAPRED UNI-PAK 21 TAB As directed x 6 days   rosuvastatin 20 MG tablet Commonly known as:  CRESTOR Take 1 tablet (20 mg total) by mouth daily.   traMADol 50 MG tablet Commonly known as:  ULTRAM Take 1-2 tablets (50-100 mg total) by mouth every 8 (eight) hours as needed.          Objective:    BP (!) 142/87   Pulse 78   Temp 97.2 F (36.2 C) (Oral)   Ht 5\' 7"  (1.702 m)   Wt 298 lb (135.2 kg)   BMI 46.67 kg/m   Allergies  Allergen  Reactions  . Codeine     itching    Wt Readings from Last 3 Encounters:  05/16/16 298 lb (135.2 kg)  05/01/16 297 lb (134.7 kg)  07/21/15 297 lb (134.7 kg)    Physical Exam  Constitutional: She is oriented to person, place, and time. She appears well-developed and well-nourished.  HENT:  Head: Normocephalic and atraumatic.  Right Ear: There is drainage and tenderness.  Left Ear: There is drainage and tenderness.  Nose: Mucosal edema and rhinorrhea present. Right sinus exhibits maxillary sinus tenderness and frontal sinus tenderness. Left sinus exhibits maxillary sinus tenderness and frontal sinus tenderness.  Mouth/Throat: Oropharyngeal exudate and posterior oropharyngeal erythema present.  Eyes: Conjunctivae and EOM are normal. Pupils are equal, round, and reactive to light.  Neck: Normal range of motion. Neck supple.  Cardiovascular: Normal rate, regular rhythm, normal heart sounds and intact distal pulses.   Pulmonary/Chest: Effort normal. She has wheezes in the right upper field and the left upper field.  Abdominal: Soft. Bowel sounds are normal.  Neurological: She is alert and oriented to person, place, and time. She has normal reflexes.  Skin: Skin is warm and dry. No rash noted.  Psychiatric: She has a normal mood and affect. Her behavior is normal. Judgment and thought content normal.        Assessment & Plan:   1. Acute bronchitis, unspecified organism - HYDROcodone-homatropine (HYCODAN) 5-1.5 MG/5ML syrup; Take 5-10 mLs by mouth every 6 (six) hours as needed for cough.  Dispense: 240 mL; Refill: 0 - cefdinir (OMNICEF) 300 MG capsule; Take 1 capsule (300 mg total) by mouth 2 (two) times daily. 1 po BID  Dispense: 20 capsule; Refill: 0 - predniSONE (STERAPRED UNI-PAK 21 TAB) 10 MG (21) TBPK tablet; As directed x 6 days  Dispense: 21 tablet; Refill: 1   Continue all other maintenance medications as listed above.  Follow up plan: Return if symptoms worsen or fail to  improve.  Educational handout given for bronchitis  Terald Sleeper PA-C [place] [address]  [place], [place] [address] [phone]   05/16/2016, 5:22 PM

## 2016-05-23 ENCOUNTER — Ambulatory Visit: Payer: No Typology Code available for payment source | Admitting: Physician Assistant

## 2016-06-12 ENCOUNTER — Other Ambulatory Visit: Payer: No Typology Code available for payment source

## 2016-06-12 DIAGNOSIS — E8881 Metabolic syndrome: Secondary | ICD-10-CM

## 2016-06-12 DIAGNOSIS — E039 Hypothyroidism, unspecified: Secondary | ICD-10-CM

## 2016-06-12 DIAGNOSIS — R809 Proteinuria, unspecified: Secondary | ICD-10-CM

## 2016-06-13 LAB — MICROALBUMIN / CREATININE URINE RATIO
Creatinine, Urine: 147.7 mg/dL
MICROALB/CREAT RATIO: 105.8 mg/g{creat} — AB (ref 0.0–30.0)
MICROALBUM., U, RANDOM: 156.3 ug/mL

## 2016-06-13 LAB — THYROID PANEL WITH TSH
Free Thyroxine Index: 2.1 (ref 1.2–4.9)
T3 UPTAKE RATIO: 25 % (ref 24–39)
T4 TOTAL: 8.3 ug/dL (ref 4.5–12.0)
TSH: 1.37 u[IU]/mL (ref 0.450–4.500)

## 2016-07-23 DIAGNOSIS — C801 Malignant (primary) neoplasm, unspecified: Secondary | ICD-10-CM

## 2016-07-23 HISTORY — DX: Malignant (primary) neoplasm, unspecified: C80.1

## 2016-08-01 ENCOUNTER — Ambulatory Visit: Payer: No Typology Code available for payment source | Admitting: Pediatrics

## 2016-08-02 ENCOUNTER — Encounter: Payer: Self-pay | Admitting: Pediatrics

## 2016-08-02 ENCOUNTER — Telehealth: Payer: Self-pay | Admitting: Pediatrics

## 2017-04-03 ENCOUNTER — Ambulatory Visit (INDEPENDENT_AMBULATORY_CARE_PROVIDER_SITE_OTHER): Payer: Medicare HMO | Admitting: Physician Assistant

## 2017-04-03 ENCOUNTER — Encounter: Payer: Self-pay | Admitting: Physician Assistant

## 2017-04-03 VITALS — BP 135/91 | HR 79 | Temp 98.4°F | Ht 67.0 in | Wt 296.0 lb

## 2017-04-03 DIAGNOSIS — E049 Nontoxic goiter, unspecified: Secondary | ICD-10-CM

## 2017-04-03 DIAGNOSIS — G8929 Other chronic pain: Secondary | ICD-10-CM | POA: Diagnosis not present

## 2017-04-03 DIAGNOSIS — R809 Proteinuria, unspecified: Secondary | ICD-10-CM

## 2017-04-03 DIAGNOSIS — I10 Essential (primary) hypertension: Secondary | ICD-10-CM | POA: Diagnosis not present

## 2017-04-03 DIAGNOSIS — E039 Hypothyroidism, unspecified: Secondary | ICD-10-CM | POA: Diagnosis not present

## 2017-04-03 DIAGNOSIS — E559 Vitamin D deficiency, unspecified: Secondary | ICD-10-CM | POA: Diagnosis not present

## 2017-04-03 DIAGNOSIS — M546 Pain in thoracic spine: Secondary | ICD-10-CM | POA: Diagnosis not present

## 2017-04-03 MED ORDER — FUROSEMIDE 20 MG PO TABS: 60.0000 mg | ORAL_TABLET | Freq: Every day | ORAL | 3 refills | Status: DC

## 2017-04-03 MED ORDER — BENAZEPRIL HCL 10 MG PO TABS: 10.0000 mg | ORAL_TABLET | Freq: Every day | ORAL | 3 refills | Status: DC

## 2017-04-03 MED ORDER — ROSUVASTATIN CALCIUM 20 MG PO TABS: 20.0000 mg | ORAL_TABLET | Freq: Every day | ORAL | 3 refills | Status: DC

## 2017-04-03 MED ORDER — TRAMADOL HCL 50 MG PO TABS: 50.0000 mg | ORAL_TABLET | Freq: Three times a day (TID) | ORAL | 2 refills | Status: DC | PRN

## 2017-04-03 NOTE — Patient Instructions (Signed)
In a few days you may receive a survey in the mail or online from [employer] regarding your visit with us today. Please take a moment to fill this out. Your feedback is very important to our whole office. It can help us better understand your needs as well as improve your experience and satisfaction. Thank you for taking your time to complete it. We care about you.  Yashica Sterbenz, PA-C  

## 2017-04-03 NOTE — Progress Notes (Signed)
mammo   BP (!) 135/91   Pulse 79   Temp 98.4 F (36.9 C) (Oral)   Ht 5' 7"  (1.702 m)   Wt 296 lb (134.3 kg)   BMI 46.36 kg/m    Subjective:    Patient ID: Brianna Kelly, female    DOB: [birth year], 66 y.o.   MRN: [id number]  HPI: Brianna Kelly is a 66 y.o. female presenting on 04/03/2017 for Follow-up (6 month )  This patient comes in for periodic recheck on medications and conditions including Hypertension, hypothyroidism, proteinuria, vitamin D deficiency. Patient also has chronic thoracic back pain. She also is experiencing some thyroid enlargement. She has a history of fibrocystic breasts. In the past she had had diagnostic mammograms with ultrasound if needed. She is going to give [place] a call back when she is due. She had been going to the clinic in [place] near her work. She is now retired. The enlargement of the right thyroid area has been over the past 2-3 months. She has never had enlargement of her thyroid before. She doesn't know of any family history of thyroid enlargement, goiter, cancer. She has a scar under her right mandible and she is uncertain of what was removed but this occurred when she was a very young child.   All medications are reviewed today. There are no reports of any problems with the medications. All of the medical conditions are reviewed and updated.  Lab work is reviewed and will be ordered as medically necessary. There are no new problems reported with today's visit.   Relevant past medical, surgical, family and social history reviewed and updated as indicated. Allergies and medications reviewed and updated.  Past Medical History:  Diagnosis Date  . Hyperlipidemia   . Hypertension   . Kidney failure 2009    kidney function  stable now  . Thyroid disease   . Vitiligo     Past Surgical History:  Procedure Laterality Date  . CHOLECYSTECTOMY  2001  . REPLACEMENT TOTAL KNEE Left 2011  . TUBAL LIGATION  1976    Review of Systems  Constitutional:  Negative.  Negative for activity change, fatigue and fever.  HENT: Negative.  Negative for sore throat, trouble swallowing and voice change.   Eyes: Negative.   Respiratory: Negative.  Negative for cough.   Cardiovascular: Negative.  Negative for chest pain.  Gastrointestinal: Negative.  Negative for abdominal pain.  Endocrine: Negative.  Negative for cold intolerance, heat intolerance, polydipsia, polyphagia and polyuria.  Genitourinary: Negative.  Negative for dysuria.  Musculoskeletal: Positive for arthralgias, back pain, gait problem and joint swelling.  Skin: Negative.  Negative for color change and pallor.    Allergies as of 04/03/2017      Reactions   Codeine    itching      Medication List       Accurate as of 04/03/17 10:26 AM. Always use your most recent med list.          albuterol 108 (90 Base) MCG/ACT inhaler Commonly known as:  PROVENTIL HFA;VENTOLIN HFA Inhale 2 puffs into the lungs every 6 (six) hours as needed for wheezing or shortness of breath.   benazepril 10 MG tablet Commonly known as:  LOTENSIN Take 1 tablet (10 mg total) by mouth daily.   clindamycin-benzoyl peroxide gel Commonly known as:  BENZACLIN   clobetasol cream 0.05 % Commonly known as:  TEMOVATE   fluocinonide ointment 0.05 % Commonly known as:  LIDEX   furosemide 20 MG tablet Commonly  known as:  LASIX Take 3 tablets (60 mg total) by mouth daily. (Two pills every am, one at 6:00 pm)   levothyroxine 100 MCG tablet Commonly known as:  SYNTHROID, LEVOTHROID Take 1 tablet (100 mcg total) by mouth daily.   rosuvastatin 20 MG tablet Commonly known as:  CRESTOR Take 1 tablet (20 mg total) by mouth daily.   traMADol 50 MG tablet Commonly known as:  ULTRAM Take 1-2 tablets (50-100 mg total) by mouth every 8 (eight) hours as needed.            Discharge Care Instructions        Start     Ordered   04/03/17 0000  CBC with Differential/Platelet     04/03/17 1012   04/03/17 0000   CMP14+EGFR     04/03/17 1012   04/03/17 0000  Lipid panel     04/03/17 1012   04/03/17 0000  Thyroid Panel With TSH     04/03/17 1012   04/03/17 0000  VITAMIN D 25 Hydroxy (Vit-D Deficiency, Fractures)     04/03/17 1012   04/03/17 0000  Microalbumin / creatinine urine ratio     04/03/17 1012   04/03/17 0000  US Soft Tissue Head/Neck    Question Answer Comment  Reason for Exam (SYMPTOM  OR DIAGNOSIS REQUIRED) thyroid enlargement   Preferred imaging location? [facility]      04/03/17 1013   04/03/17 0000  benazepril (LOTENSIN) 10 MG tablet  Daily    Question:  Supervising Provider  Answer:  Timmothy Euler   04/03/17 1018   04/03/17 0000  furosemide (LASIX) 20 MG tablet  Daily    Question:  Supervising Provider  Answer:  Timmothy Euler   04/03/17 1018   04/03/17 0000  rosuvastatin (CRESTOR) 20 MG tablet  Daily    Question:  Supervising Provider  Answer:  Timmothy Euler   04/03/17 1018   04/03/17 0000  traMADol (ULTRAM) 50 MG tablet  Every 8 hours PRN    Question:  Supervising Provider  Answer:  Timmothy Euler   04/03/17 1018         Objective:    BP (!) 135/91   Pulse 79   Temp 98.4 F (36.9 C) (Oral)   Ht 5' 7"  (1.702 m)   Wt 296 lb (134.3 kg)   BMI 46.36 kg/m   Allergies  Allergen Reactions  . Codeine     itching    Physical Exam  Constitutional: She is oriented to person, place, and time. She appears well-developed and well-nourished.  HENT:  Head: Normocephalic and atraumatic.  Right Ear: Tympanic membrane, external ear and ear canal normal.  Left Ear: Tympanic membrane, external ear and ear canal normal.  Nose: Nose normal. No rhinorrhea.  Mouth/Throat: Oropharynx is clear and moist and mucous membranes are normal. No oropharyngeal exudate or posterior oropharyngeal erythema.  Eyes: Pupils are equal, round, and reactive to light. Conjunctivae and EOM are normal.  Neck: Trachea normal and normal range of motion. Neck supple. No  tracheal tenderness present. No tracheal deviation, no edema and no erythema present. Thyroid mass present. No thyromegaly present.    Palpable firm enlargement of the right thyroid lobe. Isthmus and left area feel normal.  Cardiovascular: Normal rate, regular rhythm, normal heart sounds and intact distal pulses.   Pulmonary/Chest: Effort normal and breath sounds normal.  Abdominal: Soft. Bowel sounds are normal.  Neurological: She is alert and oriented to person, place, and  time. She has normal reflexes.  Skin: Skin is warm and dry. No rash noted.  Psychiatric: She has a normal mood and affect. Her behavior is normal. Judgment and thought content normal.    Results for orders placed or performed in visit on 06/12/16  Thyroid Panel With TSH  Result Value Ref Range   TSH 1.370 0.450 - 4.500 uIU/mL   T4, Total 8.3 4.5 - 12.0 ug/dL   T3 Uptake Ratio 25 24 - 39 %   Free Thyroxine Index 2.1 1.2 - 4.9  Microalbumin / creatinine urine ratio  Result Value Ref Range   Creatinine, Urine 147.7 Not Estab. mg/dL   Albumin, Urine 156.3 Not Estab. ug/mL   Microalb/Creat Ratio 105.8 (H) 0.0 - 30.0 mg/g creat      Assessment & Plan:   1. Thyroid enlargement - Thyroid Panel With TSH - US Soft Tissue Head/Neck; Future  2. Essential hypertension, benign - CBC with Differential/Platelet - CMP14+EGFR - Lipid panel - benazepril (LOTENSIN) 10 MG tablet; Take 1 tablet (10 mg total) by mouth daily.  Dispense: 90 tablet; Refill: 3  3. Hypothyroidism, unspecified type - Thyroid Panel With TSH  4. Proteinuria, unspecified type - CBC with Differential/Platelet - CMP14+EGFR - Microalbumin / creatinine urine ratio  5. Vitamin D deficiency - VITAMIN D 25 Hydroxy (Vit-D Deficiency, Fractures)  6. Essential hypertension - furosemide (LASIX) 20 MG tablet; Take 3 tablets (60 mg total) by mouth daily. (Two pills every am, one at 6:00 pm)  Dispense: 270 tablet; Refill: 3  7. Chronic bilateral thoracic  back pain - traMADol (ULTRAM) 50 MG tablet; Take 1-2 tablets (50-100 mg total) by mouth every 8 (eight) hours as needed.  Dispense: 45 tablet; Refill: 2    Current Outpatient Prescriptions:  .  albuterol (PROVENTIL HFA;VENTOLIN HFA) 108 (90 Base) MCG/ACT inhaler, Inhale 2 puffs into the lungs every 6 (six) hours as needed for wheezing or shortness of breath., Disp: 1 Inhaler, Rfl: 0 .  benazepril (LOTENSIN) 10 MG tablet, Take 1 tablet (10 mg total) by mouth daily., Disp: 90 tablet, Rfl: 3 .  clindamycin-benzoyl peroxide (BENZACLIN) gel, , Disp: , Rfl:  .  clobetasol cream (TEMOVATE) 0.05 %, , Disp: , Rfl:  .  fluocinonide ointment (LIDEX) 0.05 %, , Disp: , Rfl:  .  furosemide (LASIX) 20 MG tablet, Take 3 tablets (60 mg total) by mouth daily. (Two pills every am, one at 6:00 pm), Disp: 270 tablet, Rfl: 3 .  levothyroxine (SYNTHROID, LEVOTHROID) 100 MCG tablet, Take 1 tablet (100 mcg total) by mouth daily., Disp: 90 tablet, Rfl: 1 .  rosuvastatin (CRESTOR) 20 MG tablet, Take 1 tablet (20 mg total) by mouth daily., Disp: 90 tablet, Rfl: 3 .  traMADol (ULTRAM) 50 MG tablet, Take 1-2 tablets (50-100 mg total) by mouth every 8 (eight) hours as needed., Disp: 45 tablet, Rfl: 2 Continue all other maintenance medications as listed above.  Follow up plan: Return in about 6 months (around 10/01/2017) for recheck.  Educational handout given for [place] PA-C [place] [address]  [place], [place] [address] [phone]   04/03/2017, 10:26 AM

## 2017-04-04 LAB — CBC WITH DIFFERENTIAL/PLATELET
BASOS ABS: 0 10*3/uL (ref 0.0–0.2)
Basos: 0 %
EOS (ABSOLUTE): 0.2 10*3/uL (ref 0.0–0.4)
Eos: 2 %
Hematocrit: 43.2 % (ref 34.0–46.6)
Hemoglobin: 14.7 g/dL (ref 11.1–15.9)
Immature Grans (Abs): 0.1 10*3/uL (ref 0.0–0.1)
Immature Granulocytes: 1 %
LYMPHS ABS: 3.9 10*3/uL — AB (ref 0.7–3.1)
LYMPHS: 43 %
MCH: 28.8 pg (ref 26.6–33.0)
MCHC: 34 g/dL (ref 31.5–35.7)
MCV: 85 fL (ref 79–97)
Monocytes Absolute: 0.7 10*3/uL (ref 0.1–0.9)
Monocytes: 8 %
NEUTROS ABS: 4.2 10*3/uL (ref 1.4–7.0)
Neutrophils: 46 %
PLATELETS: 317 10*3/uL (ref 150–379)
RBC: 5.11 x10E6/uL (ref 3.77–5.28)
RDW: 14.6 % (ref 12.3–15.4)
WBC: 9 10*3/uL (ref 3.4–10.8)

## 2017-04-04 LAB — LIPID PANEL
CHOL/HDL RATIO: 2.8 ratio (ref 0.0–4.4)
CHOLESTEROL TOTAL: 135 mg/dL (ref 100–199)
HDL: 48 mg/dL (ref >39)
LDL Calculated: 52 mg/dL (ref 0–99)
Triglycerides: 175 mg/dL — ABNORMAL HIGH (ref 0–149)
VLDL Cholesterol Cal: 35 mg/dL (ref 5–40)

## 2017-04-04 LAB — THYROID PANEL WITH TSH
FREE THYROXINE INDEX: 2 (ref 1.2–4.9)
T3 Uptake Ratio: 26 % (ref 24–39)
T4 TOTAL: 7.5 ug/dL (ref 4.5–12.0)
TSH: 2.2 u[IU]/mL (ref 0.450–4.500)

## 2017-04-04 LAB — MICROALBUMIN / CREATININE URINE RATIO
CREATININE, UR: 158.3 mg/dL
MICROALB/CREAT RATIO: 87 mg/g{creat} — AB (ref 0.0–30.0)
MICROALBUM., U, RANDOM: 137.7 ug/mL

## 2017-04-04 LAB — CMP14+EGFR
ALBUMIN: 4.1 g/dL (ref 3.6–4.8)
ALK PHOS: 90 IU/L (ref 39–117)
ALT: 16 IU/L (ref 0–32)
AST: 16 IU/L (ref 0–40)
Albumin/Globulin Ratio: 1.8 (ref 1.2–2.2)
BILIRUBIN TOTAL: 0.7 mg/dL (ref 0.0–1.2)
BUN / CREAT RATIO: 30 — AB (ref 12–28)
BUN: 24 mg/dL (ref 8–27)
CHLORIDE: 101 mmol/L (ref 96–106)
CO2: 23 mmol/L (ref 20–29)
Calcium: 9.3 mg/dL (ref 8.7–10.3)
Creatinine, Ser: 0.79 mg/dL (ref 0.57–1.00)
GFR calc Af Amer: 91 mL/min/{1.73_m2} (ref >59)
GFR calc non Af Amer: 79 mL/min/{1.73_m2} (ref >59)
GLOBULIN, TOTAL: 2.3 g/dL (ref 1.5–4.5)
GLUCOSE: 93 mg/dL (ref 65–99)
Potassium: 4.3 mmol/L (ref 3.5–5.2)
SODIUM: 140 mmol/L (ref 134–144)
Total Protein: 6.4 g/dL (ref 6.0–8.5)

## 2017-04-04 LAB — VITAMIN D 25 HYDROXY (VIT D DEFICIENCY, FRACTURES): Vit D, 25-Hydroxy: 35.1 ng/mL (ref 30.0–100.0)

## 2017-04-05 ENCOUNTER — Other Ambulatory Visit: Payer: Self-pay | Admitting: Physician Assistant

## 2017-04-05 MED ORDER — LEVOTHYROXINE SODIUM 100 MCG PO TABS: 100.0000 ug | ORAL_TABLET | Freq: Every day | ORAL | 1 refills | Status: DC

## 2017-04-05 NOTE — Telephone Encounter (Signed)
RX sent into [facility]

## 2017-04-10 ENCOUNTER — Ambulatory Visit (HOSPITAL_COMMUNITY)
Admission: RE | Admit: 2017-04-10 | Discharge: 2017-04-10 | Disposition: A | Discharge status: Home / Self Care | Payer: Medicare HMO | Source: Ambulatory Visit | Attending: Physician Assistant | Admitting: Physician Assistant

## 2017-04-10 DIAGNOSIS — E010 Iodine-deficiency related diffuse (endemic) goiter: Secondary | ICD-10-CM | POA: Diagnosis not present

## 2017-04-10 DIAGNOSIS — E049 Nontoxic goiter, unspecified: Secondary | ICD-10-CM | POA: Insufficient documentation

## 2017-04-11 ENCOUNTER — Telehealth: Payer: Self-pay | Admitting: Pediatrics

## 2017-04-11 DIAGNOSIS — R221 Localized swelling, mass and lump, neck: Secondary | ICD-10-CM

## 2017-04-11 NOTE — Telephone Encounter (Signed)
Returning call for result

## 2017-04-16 ENCOUNTER — Encounter: Payer: Self-pay | Admitting: *Deleted

## 2017-04-25 DIAGNOSIS — D481 Neoplasm of uncertain behavior of connective and other soft tissue: Secondary | ICD-10-CM | POA: Diagnosis not present

## 2017-04-25 DIAGNOSIS — R221 Localized swelling, mass and lump, neck: Secondary | ICD-10-CM | POA: Diagnosis not present

## 2017-05-09 ENCOUNTER — Telehealth: Payer: Self-pay | Admitting: Pediatrics

## 2017-05-09 NOTE — Telephone Encounter (Signed)
[facility]

## 2017-05-13 ENCOUNTER — Other Ambulatory Visit: Payer: Self-pay | Admitting: Otolaryngology

## 2017-05-13 DIAGNOSIS — D210 Benign neoplasm of connective and other soft tissue of head, face and neck: Secondary | ICD-10-CM | POA: Diagnosis not present

## 2017-05-13 DIAGNOSIS — C8339 Diffuse large B-cell lymphoma, extranodal and solid organ sites: Secondary | ICD-10-CM | POA: Diagnosis not present

## 2017-05-13 DIAGNOSIS — C8331 Diffuse large B-cell lymphoma, lymph nodes of head, face, and neck: Secondary | ICD-10-CM | POA: Diagnosis not present

## 2017-05-16 ENCOUNTER — Telehealth: Payer: Self-pay | Admitting: Pediatrics

## 2017-05-20 DIAGNOSIS — C8330 Diffuse large B-cell lymphoma, unspecified site: Secondary | ICD-10-CM | POA: Insufficient documentation

## 2017-05-27 DIAGNOSIS — L309 Dermatitis, unspecified: Secondary | ICD-10-CM | POA: Diagnosis not present

## 2017-05-27 DIAGNOSIS — Z888 Allergy status to other drugs, medicaments and biological substances status: Secondary | ICD-10-CM | POA: Diagnosis not present

## 2017-05-27 DIAGNOSIS — L80 Vitiligo: Secondary | ICD-10-CM | POA: Diagnosis not present

## 2017-05-27 DIAGNOSIS — C8590 Non-Hodgkin lymphoma, unspecified, unspecified site: Secondary | ICD-10-CM | POA: Diagnosis not present

## 2017-05-27 DIAGNOSIS — L249 Irritant contact dermatitis, unspecified cause: Secondary | ICD-10-CM | POA: Diagnosis not present

## 2017-05-27 DIAGNOSIS — L900 Lichen sclerosus et atrophicus: Secondary | ICD-10-CM | POA: Diagnosis not present

## 2017-05-27 DIAGNOSIS — L719 Rosacea, unspecified: Secondary | ICD-10-CM | POA: Diagnosis not present

## 2017-05-27 DIAGNOSIS — Z85828 Personal history of other malignant neoplasm of skin: Secondary | ICD-10-CM | POA: Diagnosis not present

## 2017-05-30 DIAGNOSIS — C8591 Non-Hodgkin lymphoma, unspecified, lymph nodes of head, face, and neck: Secondary | ICD-10-CM | POA: Diagnosis not present

## 2017-06-05 DIAGNOSIS — C8331 Diffuse large B-cell lymphoma, lymph nodes of head, face, and neck: Secondary | ICD-10-CM | POA: Diagnosis not present

## 2017-06-05 DIAGNOSIS — Z01810 Encounter for preprocedural cardiovascular examination: Secondary | ICD-10-CM | POA: Diagnosis not present

## 2017-06-05 DIAGNOSIS — I517 Cardiomegaly: Secondary | ICD-10-CM | POA: Diagnosis not present

## 2017-06-06 DIAGNOSIS — E039 Hypothyroidism, unspecified: Secondary | ICD-10-CM | POA: Diagnosis not present

## 2017-06-06 DIAGNOSIS — C8331 Diffuse large B-cell lymphoma, lymph nodes of head, face, and neck: Secondary | ICD-10-CM | POA: Diagnosis not present

## 2017-06-06 DIAGNOSIS — I10 Essential (primary) hypertension: Secondary | ICD-10-CM | POA: Diagnosis not present

## 2017-06-07 DIAGNOSIS — Z79899 Other long term (current) drug therapy: Secondary | ICD-10-CM | POA: Diagnosis not present

## 2017-06-07 DIAGNOSIS — C8591 Non-Hodgkin lymphoma, unspecified, lymph nodes of head, face, and neck: Secondary | ICD-10-CM | POA: Diagnosis not present

## 2017-06-11 DIAGNOSIS — Z5112 Encounter for antineoplastic immunotherapy: Secondary | ICD-10-CM | POA: Diagnosis not present

## 2017-06-11 DIAGNOSIS — Z5111 Encounter for antineoplastic chemotherapy: Secondary | ICD-10-CM | POA: Diagnosis not present

## 2017-06-11 DIAGNOSIS — C8331 Diffuse large B-cell lymphoma, lymph nodes of head, face, and neck: Secondary | ICD-10-CM | POA: Diagnosis not present

## 2017-06-17 DIAGNOSIS — I129 Hypertensive chronic kidney disease with stage 1 through stage 4 chronic kidney disease, or unspecified chronic kidney disease: Secondary | ICD-10-CM | POA: Diagnosis not present

## 2017-06-17 DIAGNOSIS — E785 Hyperlipidemia, unspecified: Secondary | ICD-10-CM | POA: Diagnosis not present

## 2017-06-17 DIAGNOSIS — C8330 Diffuse large B-cell lymphoma, unspecified site: Secondary | ICD-10-CM | POA: Diagnosis not present

## 2017-06-17 DIAGNOSIS — E039 Hypothyroidism, unspecified: Secondary | ICD-10-CM | POA: Diagnosis not present

## 2017-06-17 DIAGNOSIS — N189 Chronic kidney disease, unspecified: Secondary | ICD-10-CM | POA: Diagnosis not present

## 2017-06-17 DIAGNOSIS — Z9221 Personal history of antineoplastic chemotherapy: Secondary | ICD-10-CM | POA: Diagnosis not present

## 2017-06-17 DIAGNOSIS — Z885 Allergy status to narcotic agent status: Secondary | ICD-10-CM | POA: Diagnosis not present

## 2017-06-17 DIAGNOSIS — Z79899 Other long term (current) drug therapy: Secondary | ICD-10-CM | POA: Diagnosis not present

## 2017-07-04 DIAGNOSIS — C8331 Diffuse large B-cell lymphoma, lymph nodes of head, face, and neck: Secondary | ICD-10-CM | POA: Diagnosis not present

## 2017-07-25 DIAGNOSIS — C8331 Diffuse large B-cell lymphoma, lymph nodes of head, face, and neck: Secondary | ICD-10-CM | POA: Diagnosis not present

## 2017-07-25 DIAGNOSIS — Z5111 Encounter for antineoplastic chemotherapy: Secondary | ICD-10-CM | POA: Diagnosis not present

## 2017-08-15 DIAGNOSIS — F419 Anxiety disorder, unspecified: Secondary | ICD-10-CM | POA: Diagnosis not present

## 2017-08-15 DIAGNOSIS — Z510 Encounter for antineoplastic radiation therapy: Secondary | ICD-10-CM | POA: Diagnosis not present

## 2017-08-15 DIAGNOSIS — Z79899 Other long term (current) drug therapy: Secondary | ICD-10-CM | POA: Diagnosis not present

## 2017-08-15 DIAGNOSIS — R946 Abnormal results of thyroid function studies: Secondary | ICD-10-CM | POA: Diagnosis not present

## 2017-08-15 DIAGNOSIS — R110 Nausea: Secondary | ICD-10-CM | POA: Diagnosis not present

## 2017-08-15 DIAGNOSIS — C8331 Diffuse large B-cell lymphoma, lymph nodes of head, face, and neck: Secondary | ICD-10-CM | POA: Diagnosis not present

## 2017-08-16 ENCOUNTER — Other Ambulatory Visit: Payer: Self-pay | Admitting: Physician Assistant

## 2017-08-22 DIAGNOSIS — Z79899 Other long term (current) drug therapy: Secondary | ICD-10-CM | POA: Diagnosis not present

## 2017-08-22 DIAGNOSIS — C8331 Diffuse large B-cell lymphoma, lymph nodes of head, face, and neck: Secondary | ICD-10-CM | POA: Diagnosis not present

## 2017-08-22 DIAGNOSIS — Z5111 Encounter for antineoplastic chemotherapy: Secondary | ICD-10-CM | POA: Diagnosis not present

## 2017-09-12 DIAGNOSIS — E039 Hypothyroidism, unspecified: Secondary | ICD-10-CM | POA: Diagnosis not present

## 2017-09-12 DIAGNOSIS — R590 Localized enlarged lymph nodes: Secondary | ICD-10-CM | POA: Diagnosis not present

## 2017-09-12 DIAGNOSIS — C8331 Diffuse large B-cell lymphoma, lymph nodes of head, face, and neck: Secondary | ICD-10-CM | POA: Diagnosis not present

## 2017-10-03 DIAGNOSIS — C8331 Diffuse large B-cell lymphoma, lymph nodes of head, face, and neck: Secondary | ICD-10-CM | POA: Diagnosis not present

## 2017-10-04 ENCOUNTER — Ambulatory Visit: Payer: Medicare HMO | Admitting: Physician Assistant

## 2017-10-28 DIAGNOSIS — L900 Lichen sclerosus et atrophicus: Secondary | ICD-10-CM | POA: Diagnosis not present

## 2017-10-28 DIAGNOSIS — L308 Other specified dermatitis: Secondary | ICD-10-CM | POA: Diagnosis not present

## 2017-10-28 DIAGNOSIS — B379 Candidiasis, unspecified: Secondary | ICD-10-CM | POA: Diagnosis not present

## 2017-11-28 DIAGNOSIS — E041 Nontoxic single thyroid nodule: Secondary | ICD-10-CM | POA: Diagnosis not present

## 2017-11-28 DIAGNOSIS — E049 Nontoxic goiter, unspecified: Secondary | ICD-10-CM | POA: Diagnosis not present

## 2017-11-28 DIAGNOSIS — Z8572 Personal history of non-Hodgkin lymphomas: Secondary | ICD-10-CM | POA: Diagnosis not present

## 2017-11-28 DIAGNOSIS — G629 Polyneuropathy, unspecified: Secondary | ICD-10-CM | POA: Diagnosis not present

## 2017-11-28 DIAGNOSIS — E039 Hypothyroidism, unspecified: Secondary | ICD-10-CM | POA: Diagnosis not present

## 2017-12-04 ENCOUNTER — Encounter: Payer: Self-pay | Admitting: Physician Assistant

## 2017-12-04 ENCOUNTER — Ambulatory Visit: Payer: Medicare HMO | Admitting: Physician Assistant

## 2017-12-04 ENCOUNTER — Encounter (INDEPENDENT_AMBULATORY_CARE_PROVIDER_SITE_OTHER): Payer: Self-pay

## 2017-12-04 VITALS — BP 121/83 | HR 98 | Temp 96.8°F | Ht 67.0 in | Wt 281.0 lb

## 2017-12-04 DIAGNOSIS — Z1231 Encounter for screening mammogram for malignant neoplasm of breast: Secondary | ICD-10-CM

## 2017-12-04 DIAGNOSIS — I10 Essential (primary) hypertension: Secondary | ICD-10-CM | POA: Diagnosis not present

## 2017-12-04 DIAGNOSIS — Z1239 Encounter for other screening for malignant neoplasm of breast: Secondary | ICD-10-CM

## 2017-12-04 DIAGNOSIS — E039 Hypothyroidism, unspecified: Secondary | ICD-10-CM | POA: Diagnosis not present

## 2017-12-04 DIAGNOSIS — Z1211 Encounter for screening for malignant neoplasm of colon: Secondary | ICD-10-CM | POA: Diagnosis not present

## 2017-12-04 DIAGNOSIS — C8331 Diffuse large B-cell lymphoma, lymph nodes of head, face, and neck: Secondary | ICD-10-CM | POA: Diagnosis not present

## 2017-12-04 MED ORDER — FLUCONAZOLE 150 MG PO TABS: 150.0000 mg | ORAL_TABLET | Freq: Once | ORAL | 0 refills | Status: AC

## 2017-12-04 MED ORDER — AMOXICILLIN 500 MG PO CAPS: 500.0000 mg | ORAL_CAPSULE | Freq: Three times a day (TID) | ORAL | 0 refills | Status: DC

## 2017-12-04 NOTE — Progress Notes (Signed)
BP 121/83   Pulse 98   Temp (!) 96.8 F (36 C) (Oral)   Ht 5' 7"  (1.702 m)   Wt 281 lb (127.5 kg)   BMI 44.01 kg/m    Subjective:    Patient ID: Francie Massing, female    DOB: 08-Oct-1950, 67 y.o.   MRN: [id number]  HPI: Fabian Coca is a 67 y.o. female presenting on 12/04/2017 for Medical Management of Chronic Issues  This patient came in for a recheck of chronic medical conditions.  She does have hypertension, hypothyroidism, lymphoma.  She is under the care of oncology through [facility].  She does need updated mammogram, colonoscopy.  We will place an order for that.  She also still having some thrush.  She took Diflucan 100 for 8 days but it is not completely better at this time.  She would like to have a refill on this medicine.  She notes that she has had bright red bleeding at times but feels like it is some type of internal hemorrhoid.  She is not having any GI distress from her cancer treatment.  Labs will be sent to Dr. Dellis Filbert fax [phone].   Past Medical History:  Diagnosis Date  . Hyperlipidemia   . Hypertension   . Kidney failure 2009    kidney function  stable now  . Thyroid disease   . Vitiligo    Relevant past medical, surgical, family and social history reviewed and updated as indicated. Interim medical history since our last visit reviewed. Allergies and medications reviewed and updated. DATA REVIEWED: CHART IN EPIC  Family History reviewed for pertinent findings.  Review of Systems  Constitutional: Negative.  Negative for activity change, fatigue and fever.  HENT: Negative.   Eyes: Negative.   Respiratory: Negative.  Negative for cough.   Cardiovascular: Negative.  Negative for chest pain.  Gastrointestinal: Negative.  Negative for abdominal pain.  Endocrine: Negative.   Genitourinary: Negative.  Negative for dysuria.  Musculoskeletal: Positive for arthralgias.  Skin: Negative.   Neurological: Negative.     Allergies as of  12/04/2017      Reactions   Codeine    itching      Medication List        Accurate as of 12/04/17  2:09 PM. Always use your most recent med list.          albuterol 108 (90 Base) MCG/ACT inhaler Commonly known as:  PROVENTIL HFA;VENTOLIN HFA Inhale 2 puffs into the lungs every 6 (six) hours as needed for wheezing or shortness of breath.   amoxicillin 500 MG capsule Commonly known as:  AMOXIL Take 1 capsule (500 mg total) by mouth 3 (three) times daily.   benazepril 10 MG tablet Commonly known as:  LOTENSIN Take 1 tablet (10 mg total) by mouth daily.   clindamycin-benzoyl peroxide gel Commonly known as:  BENZACLIN   clobetasol cream 0.05 % Commonly known as:  TEMOVATE   fluconazole 150 MG tablet Commonly known as:  DIFLUCAN Take 1 tablet (150 mg total) by mouth once for 1 dose.   fluocinonide ointment 0.05 % Commonly known as:  LIDEX   furosemide 20 MG tablet Commonly known as:  LASIX Take 3 tablets (60 mg total) by mouth daily. (Two pills every am, one at 6:00 pm)   levothyroxine 88 MCG tablet Commonly known as:  SYNTHROID, LEVOTHROID Take 88 mcg by mouth daily before breakfast.   rosuvastatin 20 MG tablet Commonly known as:  CRESTOR  Take 1 tablet (20 mg total) by mouth daily.   traMADol 50 MG tablet Commonly known as:  ULTRAM Take 1-2 tablets (50-100 mg total) by mouth every 8 (eight) hours as needed.          Objective:    BP 121/83   Pulse 98   Temp (!) 96.8 F (36 C) (Oral)   Ht '5\' 7"'$  (1.702 m)   Wt 281 lb (127.5 kg)   BMI 44.01 kg/m   Allergies  Allergen Reactions  . Codeine     itching    Wt Readings from Last 3 Encounters:  12/04/17 281 lb (127.5 kg)  04/03/17 296 lb (134.3 kg)  05/16/16 298 lb (135.2 kg)    Physical Exam  Constitutional: She is oriented to person, place, and time. She appears well-developed and well-nourished.  HENT:  Head: Normocephalic and atraumatic.  Right Ear: Tympanic membrane, external ear and ear canal  normal.  Left Ear: Tympanic membrane, external ear and ear canal normal.  Nose: Nose normal. No rhinorrhea.  Mouth/Throat: Oropharynx is clear and moist and mucous membranes are normal. No oropharyngeal exudate or posterior oropharyngeal erythema.  Eyes: Pupils are equal, round, and reactive to light. Conjunctivae and EOM are normal.  Neck: Normal range of motion. Neck supple.  Cardiovascular: Normal rate, regular rhythm, normal heart sounds and intact distal pulses.  Pulmonary/Chest: Effort normal and breath sounds normal.  Abdominal: Soft. Bowel sounds are normal.  Neurological: She is alert and oriented to person, place, and time. She has normal reflexes.  Skin: Skin is warm and dry. No rash noted.  Psychiatric: She has a normal mood and affect. Her behavior is normal. Judgment and thought content normal.    Results for orders placed or performed in visit on 04/03/17  CBC with Differential/Platelet  Result Value Ref Range   WBC 9.0 3.4 - 10.8 x10E3/uL   RBC 5.11 3.77 - 5.28 x10E6/uL   Hemoglobin 14.7 11.1 - 15.9 g/dL   Hematocrit 43.2 34.0 - 46.6 %   MCV 85 79 - 97 fL   MCH 28.8 26.6 - 33.0 pg   MCHC 34.0 31.5 - 35.7 g/dL   RDW 14.6 12.3 - 15.4 %   Platelets 317 150 - 379 x10E3/uL   Neutrophils 46 Not Estab. %   Lymphs 43 Not Estab. %   Monocytes 8 Not Estab. %   Eos 2 Not Estab. %   Basos 0 Not Estab. %   Neutrophils Absolute 4.2 1.4 - 7.0 x10E3/uL   Lymphocytes Absolute 3.9 (H) 0.7 - 3.1 x10E3/uL   Monocytes Absolute 0.7 0.1 - 0.9 x10E3/uL   EOS (ABSOLUTE) 0.2 0.0 - 0.4 x10E3/uL   Basophils Absolute 0.0 0.0 - 0.2 x10E3/uL   Immature Granulocytes 1 Not Estab. %   Immature Grans (Abs) 0.1 0.0 - 0.1 x10E3/uL  CMP14+EGFR  Result Value Ref Range   Glucose 93 65 - 99 mg/dL   BUN 24 8 - 27 mg/dL   Creatinine, Ser 0.79 0.57 - 1.00 mg/dL   GFR calc non Af Amer 79 >59 mL/min/1.73   GFR calc Af Amer 91 >59 mL/min/1.73   BUN/Creatinine Ratio 30 (H) 12 - 28   Sodium 140 134 -  144 mmol/L   Potassium 4.3 3.5 - 5.2 mmol/L   Chloride 101 96 - 106 mmol/L   CO2 23 20 - 29 mmol/L   Calcium 9.3 8.7 - 10.3 mg/dL   Total Protein 6.4 6.0 - 8.5 g/dL   Albumin 4.1 3.6 -  4.8 g/dL   Globulin, Total 2.3 1.5 - 4.5 g/dL   Albumin/Globulin Ratio 1.8 1.2 - 2.2   Bilirubin Total 0.7 0.0 - 1.2 mg/dL   Alkaline Phosphatase 90 39 - 117 IU/L   AST 16 0 - 40 IU/L   ALT 16 0 - 32 IU/L  Lipid panel  Result Value Ref Range   Cholesterol, Total 135 100 - 199 mg/dL   Triglycerides 175 (H) 0 - 149 mg/dL   HDL 48 >39 mg/dL   VLDL Cholesterol Cal 35 5 - 40 mg/dL   LDL Calculated 52 0 - 99 mg/dL   Chol/HDL Ratio 2.8 0.0 - 4.4 ratio  Thyroid Panel With TSH  Result Value Ref Range   TSH 2.200 0.450 - 4.500 uIU/mL   T4, Total 7.5 4.5 - 12.0 ug/dL   T3 Uptake Ratio 26 24 - 39 %   Free Thyroxine Index 2.0 1.2 - 4.9  VITAMIN D 25 Hydroxy (Vit-D Deficiency, Fractures)  Result Value Ref Range   Vit D, 25-Hydroxy 35.1 30.0 - 100.0 ng/mL  Microalbumin / creatinine urine ratio  Result Value Ref Range   Creatinine, Urine 158.3 Not Estab. mg/dL   Microalbumin, Urine 137.7 Not Estab. ug/mL   Microalb/Creat Ratio 87.0 (H) 0.0 - 30.0 mg/g creat      Assessment & Plan:   1. Diffuse large B-cell lymphoma of lymph nodes of neck (HCC) - CBC with Differential/Platelet - CMP14+EGFR - Lipid panel - Thyroid Panel With TSH  2. Essential hypertension, benign - CBC with Differential/Platelet - CMP14+EGFR - Lipid panel - Thyroid Panel With TSH - Microalbumin / creatinine urine ratio  3. Hypothyroidism, unspecified type - Thyroid Panel With TSH  4. Screening for colon cancer - Ambulatory referral to Gastroenterology  5. Screening for breast cancer - MM Digital Screening; Future   Continue all other maintenance medications as listed above.  Follow up plan: Return in about 6 months (around 06/06/2018) for recheck.  Educational handout given for [place] PA-C [place] [address]  [place], [place] [address] [phone]   12/04/2017, 2:09 PM

## 2017-12-04 NOTE — Patient Instructions (Signed)
In a few days you may receive a survey in the mail or online from [employer] regarding your visit with us today. Please take a moment to fill this out. Your feedback is very important to our whole office. It can help us better understand your needs as well as improve your experience and satisfaction. Thank you for taking your time to complete it. We care about you.  Angelos Wasco, PA-C  

## 2017-12-05 ENCOUNTER — Telehealth: Payer: Self-pay | Admitting: Physician Assistant

## 2017-12-05 LAB — CBC WITH DIFFERENTIAL/PLATELET
BASOS ABS: 0 10*3/uL (ref 0.0–0.2)
Basos: 0 %
EOS (ABSOLUTE): 0.3 10*3/uL (ref 0.0–0.4)
Eos: 4 %
Hematocrit: 41.2 % (ref 34.0–46.6)
Hemoglobin: 13 g/dL (ref 11.1–15.9)
IMMATURE GRANULOCYTES: 0 %
Immature Grans (Abs): 0 10*3/uL (ref 0.0–0.1)
Lymphocytes Absolute: 1.2 10*3/uL (ref 0.7–3.1)
Lymphs: 16 %
MCH: 29.3 pg (ref 26.6–33.0)
MCHC: 31.6 g/dL (ref 31.5–35.7)
MCV: 93 fL (ref 79–97)
Monocytes Absolute: 0.9 10*3/uL (ref 0.1–0.9)
Monocytes: 12 %
NEUTROS PCT: 68 %
Neutrophils Absolute: 5.1 10*3/uL (ref 1.4–7.0)
PLATELETS: 329 10*3/uL (ref 150–379)
RBC: 4.43 x10E6/uL (ref 3.77–5.28)
RDW: 14.1 % (ref 12.3–15.4)
WBC: 7.5 10*3/uL (ref 3.4–10.8)

## 2017-12-05 LAB — CMP14+EGFR
ALT: 24 IU/L (ref 0–32)
AST: 25 IU/L (ref 0–40)
Albumin/Globulin Ratio: 2 (ref 1.2–2.2)
Albumin: 4 g/dL (ref 3.6–4.8)
Alkaline Phosphatase: 99 IU/L (ref 39–117)
BILIRUBIN TOTAL: 0.6 mg/dL (ref 0.0–1.2)
BUN/Creatinine Ratio: 18 (ref 12–28)
BUN: 14 mg/dL (ref 8–27)
CALCIUM: 9.4 mg/dL (ref 8.7–10.3)
CHLORIDE: 103 mmol/L (ref 96–106)
CO2: 24 mmol/L (ref 20–29)
Creatinine, Ser: 0.79 mg/dL (ref 0.57–1.00)
GFR calc non Af Amer: 79 mL/min/{1.73_m2} (ref >59)
GFR, EST AFRICAN AMERICAN: 91 mL/min/{1.73_m2} (ref >59)
GLUCOSE: 109 mg/dL — AB (ref 65–99)
Globulin, Total: 2 g/dL (ref 1.5–4.5)
Potassium: 4.1 mmol/L (ref 3.5–5.2)
Sodium: 143 mmol/L (ref 134–144)
TOTAL PROTEIN: 6 g/dL (ref 6.0–8.5)

## 2017-12-05 LAB — LIPID PANEL
Chol/HDL Ratio: 2.8 ratio (ref 0.0–4.4)
Cholesterol, Total: 120 mg/dL (ref 100–199)
HDL: 43 mg/dL (ref >39)
LDL Calculated: 42 mg/dL (ref 0–99)
TRIGLYCERIDES: 174 mg/dL — AB (ref 0–149)
VLDL CHOLESTEROL CAL: 35 mg/dL (ref 5–40)

## 2017-12-05 LAB — THYROID PANEL WITH TSH
Free Thyroxine Index: 1.8 (ref 1.2–4.9)
T3 Uptake Ratio: 23 % — ABNORMAL LOW (ref 24–39)
T4, Total: 7.9 ug/dL (ref 4.5–12.0)
TSH: 1.82 u[IU]/mL (ref 0.450–4.500)

## 2017-12-05 NOTE — Telephone Encounter (Signed)
Scheduled for 5/21.

## 2017-12-09 ENCOUNTER — Telehealth: Payer: Self-pay | Admitting: Physician Assistant

## 2017-12-09 NOTE — Telephone Encounter (Signed)
I was in error if I told her to NOT leave a urine/ There is an order placed, but not sure if a BMET is needed. Whenever she can come in.

## 2017-12-09 NOTE — Telephone Encounter (Signed)
Patient aware to come in whenever she can to leave a urine sample

## 2017-12-09 NOTE — Telephone Encounter (Signed)
Pt called back wanting to talk to Abigail Butts and it looks like she had called regarding test results wanting to know about Microalbumin but pt states Glenard Haring told her she didn't need to leave urine. Did you want her to?

## 2017-12-11 DIAGNOSIS — Z1231 Encounter for screening mammogram for malignant neoplasm of breast: Secondary | ICD-10-CM | POA: Diagnosis not present

## 2017-12-11 LAB — HM MAMMOGRAPHY

## 2017-12-17 ENCOUNTER — Other Ambulatory Visit: Payer: Medicare HMO

## 2017-12-17 DIAGNOSIS — R300 Dysuria: Secondary | ICD-10-CM | POA: Diagnosis not present

## 2017-12-17 LAB — MICROSCOPIC EXAMINATION
RBC, UA: NONE SEEN /hpf (ref 0–2)
Renal Epithel, UA: NONE SEEN /hpf

## 2017-12-17 LAB — URINALYSIS, COMPLETE
Bilirubin, UA: NEGATIVE
GLUCOSE, UA: NEGATIVE
KETONES UA: NEGATIVE
LEUKOCYTES UA: NEGATIVE
NITRITE UA: NEGATIVE
PROTEIN UA: NEGATIVE
SPEC GRAV UA: 1.015 (ref 1.005–1.030)
Urobilinogen, Ur: 0.2 mg/dL (ref 0.2–1.0)
pH, UA: 5 (ref 5.0–7.5)

## 2017-12-19 LAB — URINE CULTURE

## 2017-12-31 ENCOUNTER — Other Ambulatory Visit: Payer: Self-pay | Admitting: *Deleted

## 2017-12-31 MED ORDER — LEVOTHYROXINE SODIUM 88 MCG PO TABS: 88.0000 ug | ORAL_TABLET | Freq: Every day | ORAL | 3 refills | Status: DC

## 2018-01-07 ENCOUNTER — Encounter: Payer: Self-pay | Admitting: Physician Assistant

## 2018-02-26 NOTE — Progress Notes (Signed)
In Care Everywhere  

## 2018-03-21 DIAGNOSIS — E042 Nontoxic multinodular goiter: Secondary | ICD-10-CM | POA: Diagnosis not present

## 2018-03-21 DIAGNOSIS — E039 Hypothyroidism, unspecified: Secondary | ICD-10-CM | POA: Diagnosis not present

## 2018-03-21 DIAGNOSIS — E041 Nontoxic single thyroid nodule: Secondary | ICD-10-CM | POA: Diagnosis not present

## 2018-03-27 DIAGNOSIS — E041 Nontoxic single thyroid nodule: Secondary | ICD-10-CM | POA: Diagnosis not present

## 2018-03-27 DIAGNOSIS — D440 Neoplasm of uncertain behavior of thyroid gland: Secondary | ICD-10-CM | POA: Diagnosis not present

## 2018-04-03 DIAGNOSIS — C8330 Diffuse large B-cell lymphoma, unspecified site: Secondary | ICD-10-CM | POA: Diagnosis not present

## 2018-04-03 DIAGNOSIS — Z96652 Presence of left artificial knee joint: Secondary | ICD-10-CM | POA: Diagnosis not present

## 2018-04-03 DIAGNOSIS — L900 Lichen sclerosus et atrophicus: Secondary | ICD-10-CM | POA: Diagnosis not present

## 2018-04-03 DIAGNOSIS — E039 Hypothyroidism, unspecified: Secondary | ICD-10-CM | POA: Diagnosis not present

## 2018-04-03 DIAGNOSIS — G620 Drug-induced polyneuropathy: Secondary | ICD-10-CM | POA: Diagnosis not present

## 2018-04-03 DIAGNOSIS — N017 Rapidly progressive nephritic syndrome with diffuse crescentic glomerulonephritis: Secondary | ICD-10-CM | POA: Diagnosis not present

## 2018-04-03 DIAGNOSIS — E041 Nontoxic single thyroid nodule: Secondary | ICD-10-CM | POA: Diagnosis not present

## 2018-04-03 DIAGNOSIS — L308 Other specified dermatitis: Secondary | ICD-10-CM | POA: Diagnosis not present

## 2018-04-03 DIAGNOSIS — E785 Hyperlipidemia, unspecified: Secondary | ICD-10-CM | POA: Diagnosis not present

## 2018-04-03 DIAGNOSIS — T451X5S Adverse effect of antineoplastic and immunosuppressive drugs, sequela: Secondary | ICD-10-CM | POA: Diagnosis not present

## 2018-04-03 DIAGNOSIS — L719 Rosacea, unspecified: Secondary | ICD-10-CM | POA: Diagnosis not present

## 2018-04-17 ENCOUNTER — Other Ambulatory Visit: Payer: Self-pay | Admitting: Physician Assistant

## 2018-04-17 DIAGNOSIS — I10 Essential (primary) hypertension: Secondary | ICD-10-CM

## 2018-05-23 ENCOUNTER — Ambulatory Visit (INDEPENDENT_AMBULATORY_CARE_PROVIDER_SITE_OTHER): Payer: Medicare HMO | Admitting: Physician Assistant

## 2018-05-23 ENCOUNTER — Encounter: Payer: Self-pay | Admitting: Physician Assistant

## 2018-05-23 VITALS — BP 130/82 | HR 78 | Temp 98.2°F | Ht 67.0 in | Wt 266.4 lb

## 2018-05-23 DIAGNOSIS — M546 Pain in thoracic spine: Secondary | ICD-10-CM

## 2018-05-23 DIAGNOSIS — R202 Paresthesia of skin: Secondary | ICD-10-CM | POA: Diagnosis not present

## 2018-05-23 DIAGNOSIS — I10 Essential (primary) hypertension: Secondary | ICD-10-CM | POA: Diagnosis not present

## 2018-05-23 DIAGNOSIS — R350 Frequency of micturition: Secondary | ICD-10-CM | POA: Diagnosis not present

## 2018-05-23 DIAGNOSIS — Z23 Encounter for immunization: Secondary | ICD-10-CM

## 2018-05-23 DIAGNOSIS — E049 Nontoxic goiter, unspecified: Secondary | ICD-10-CM | POA: Diagnosis not present

## 2018-05-23 DIAGNOSIS — C8331 Diffuse large B-cell lymphoma, lymph nodes of head, face, and neck: Secondary | ICD-10-CM | POA: Diagnosis not present

## 2018-05-23 DIAGNOSIS — G8929 Other chronic pain: Secondary | ICD-10-CM | POA: Insufficient documentation

## 2018-05-23 DIAGNOSIS — B370 Candidal stomatitis: Secondary | ICD-10-CM

## 2018-05-23 LAB — URINALYSIS, COMPLETE
BILIRUBIN UA: NEGATIVE
GLUCOSE, UA: NEGATIVE
KETONES UA: NEGATIVE
NITRITE UA: NEGATIVE
RBC UA: NEGATIVE
SPEC GRAV UA: 1.015 (ref 1.005–1.030)
UUROB: 2 mg/dL — AB (ref 0.2–1.0)
pH, UA: 7.5 (ref 5.0–7.5)

## 2018-05-23 LAB — MICROSCOPIC EXAMINATION: WBC, UA: >30 /hpf — AB (ref 0–5)

## 2018-05-23 MED ORDER — FLUCONAZOLE 150 MG PO TABS: ORAL_TABLET | ORAL | 6 refills | Status: DC

## 2018-05-23 MED ORDER — TRAMADOL HCL 50 MG PO TABS: 50.0000 mg | ORAL_TABLET | Freq: Three times a day (TID) | ORAL | 2 refills | Status: DC | PRN

## 2018-05-23 MED ORDER — GABAPENTIN 300 MG PO CAPS: 300.0000 mg | ORAL_CAPSULE | Freq: Three times a day (TID) | ORAL | 3 refills | Status: DC

## 2018-05-24 LAB — CBC WITH DIFFERENTIAL/PLATELET
BASOS ABS: 0 10*3/uL (ref 0.0–0.2)
Basos: 0 %
EOS (ABSOLUTE): 0.4 10*3/uL (ref 0.0–0.4)
EOS: 5 %
HEMATOCRIT: 42.2 % (ref 34.0–46.6)
HEMOGLOBIN: 13.5 g/dL (ref 11.1–15.9)
IMMATURE GRANS (ABS): 0 10*3/uL (ref 0.0–0.1)
Immature Granulocytes: 0 %
LYMPHS: 26 %
Lymphocytes Absolute: 2 10*3/uL (ref 0.7–3.1)
MCH: 27.6 pg (ref 26.6–33.0)
MCHC: 32 g/dL (ref 31.5–35.7)
MCV: 86 fL (ref 79–97)
MONOCYTES: 10 %
Monocytes Absolute: 0.7 10*3/uL (ref 0.1–0.9)
Neutrophils Absolute: 4.4 10*3/uL (ref 1.4–7.0)
Neutrophils: 59 %
Platelets: 301 10*3/uL (ref 150–450)
RBC: 4.89 x10E6/uL (ref 3.77–5.28)
RDW: 14.1 % (ref 12.3–15.4)
WBC: 7.6 10*3/uL (ref 3.4–10.8)

## 2018-05-24 LAB — LIPID PANEL
CHOL/HDL RATIO: 2.2 ratio (ref 0.0–4.4)
CHOLESTEROL TOTAL: 108 mg/dL (ref 100–199)
HDL: 49 mg/dL (ref >39)
LDL Calculated: 38 mg/dL (ref 0–99)
TRIGLYCERIDES: 104 mg/dL (ref 0–149)
VLDL Cholesterol Cal: 21 mg/dL (ref 5–40)

## 2018-05-24 LAB — CMP14+EGFR
ALK PHOS: 115 IU/L (ref 39–117)
ALT: 17 IU/L (ref 0–32)
AST: 20 IU/L (ref 0–40)
Albumin/Globulin Ratio: 2 (ref 1.2–2.2)
Albumin: 3.9 g/dL (ref 3.6–4.8)
BUN/Creatinine Ratio: 13 (ref 12–28)
BUN: 11 mg/dL (ref 8–27)
Bilirubin Total: 0.5 mg/dL (ref 0.0–1.2)
CHLORIDE: 100 mmol/L (ref 96–106)
CO2: 25 mmol/L (ref 20–29)
Calcium: 9.2 mg/dL (ref 8.7–10.3)
Creatinine, Ser: 0.83 mg/dL (ref 0.57–1.00)
GFR calc Af Amer: 85 mL/min/{1.73_m2} (ref >59)
GFR calc non Af Amer: 74 mL/min/{1.73_m2} (ref >59)
GLUCOSE: 98 mg/dL (ref 65–99)
Globulin, Total: 2 g/dL (ref 1.5–4.5)
Potassium: 4.2 mmol/L (ref 3.5–5.2)
Sodium: 143 mmol/L (ref 134–144)
TOTAL PROTEIN: 5.9 g/dL — AB (ref 6.0–8.5)

## 2018-05-24 LAB — THYROID PANEL WITH TSH
FREE THYROXINE INDEX: 2.6 (ref 1.2–4.9)
T3 Uptake Ratio: 27 % (ref 24–39)
T4 TOTAL: 9.6 ug/dL (ref 4.5–12.0)
TSH: 1.88 u[IU]/mL (ref 0.450–4.500)

## 2018-05-25 LAB — URINE CULTURE

## 2018-05-26 ENCOUNTER — Other Ambulatory Visit: Payer: Self-pay | Admitting: Physician Assistant

## 2018-05-26 MED ORDER — SULFAMETHOXAZOLE-TRIMETHOPRIM 800-160 MG PO TABS: 1.0000 | ORAL_TABLET | Freq: Two times a day (BID) | ORAL | 0 refills | Status: DC

## 2018-05-26 NOTE — Progress Notes (Signed)
BP 130/82   Pulse 78   Temp 98.2 F (36.8 C) (Oral)   Ht _0  (1.702 m)   Wt 266 lb 6 oz (120.8 kg)   BMI 41.72 kg/m    Subjective:    Patient ID: Brianna Kelly, female    DOB: 22-Nov-1950, 67 y.o.   MRN: [id number]  HPI: Brianna Kelly is a 67 y.o. 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  She is continued with blisters in her mouth secondary to the chemotherapy that she is taking for her lymphoma.  She is still under the care of physicians at [facility].  She does need refills on medication that she takes for hypertension, hyper lipidemia, hypothyroidism.  We will perform labs today.  She also feels that her left ear is slightly congested.  She denies any fever or chills.  No nausea vomiting or diarrhea.  She has not been exposed to anyone else with a cold.  She denies any sore throat.  Past Medical History:  Diagnosis Date  . Hyperlipidemia   . Hypertension   . Kidney failure 2009    kidney function  stable now  . Thyroid disease   . Vitiligo    Relevant past medical, surgical, family and social history reviewed and updated as indicated. Interim medical history since our last visit reviewed. Allergies and medications reviewed and updated. DATA REVIEWED: CHART IN EPIC  Family History reviewed for pertinent findings.  Review of Systems  Constitutional: Positive for fatigue. Negative for activity change and fever.  HENT: Positive for ear pain and mouth sores.   Eyes: Negative.   Respiratory: Negative.  Negative for cough.   Cardiovascular: Negative.  Negative for chest pain.  Gastrointestinal: Negative.  Negative for abdominal pain.  Endocrine: Positive for cold intolerance.  Genitourinary: Negative.   Negative for dysuria.  Musculoskeletal: Negative.   Skin: Negative.   Neurological: Negative.     Allergies as of 05/23/2018      Reactions   Codeine    itching      Medication List        Accurate as of 05/23/18 11:59 PM. Always use your most recent med list.          albuterol 108 (90 Base) MCG/ACT inhaler Commonly known as:  PROVENTIL HFA;VENTOLIN HFA Inhale 2 puffs into the lungs every 6 (six) hours as needed for wheezing or shortness of breath.   benazepril 10 MG tablet Commonly known as:  LOTENSIN TAKE 1 TABLET BY MOUTH ONCE DAILY   clindamycin-benzoyl peroxide gel Commonly known as:  BENZACLIN   clobetasol cream 0.05 % Commonly known as:  TEMOVATE   fluconazole 150 MG tablet Commonly known as:  DIFLUCAN One daily one week, then one weekly   fluocinonide ointment 0.05 % Commonly known as:  LIDEX   furosemide 20 MG tablet Commonly known as:  LASIX TAKE 2 TABLETS BY MOUTH IN THE MORNING AND TAKE 1 TABLET AT 6PM   gabapentin 300 MG capsule Commonly known as:  NEURONTIN Take 1 capsule (300 mg total) by mouth 3 (three) times daily.   levothyroxine 88 MCG tablet Commonly known as:  SYNTHROID, LEVOTHROID Take 1 tablet (88 mcg total) by mouth daily before breakfast.   rosuvastatin 20 MG  tablet Commonly known as:  CRESTOR TAKE 1 TABLET BY MOUTH ONCE DAILY   traMADol 50 MG tablet Commonly known as:  ULTRAM Take 1-2 tablets (50-100 mg total) by mouth every 8 (eight) hours as needed.          Objective:    BP 130/82   Pulse 78   Temp 98.2 F (36.8 C) (Oral)   Ht _0  (1.702 m)   Wt 266 lb 6 oz (120.8 kg)   BMI 41.72 kg/m   Allergies  Allergen Reactions  . Codeine     itching    Wt Readings from Last 3 Encounters:  05/23/18 266 lb 6 oz (120.8 kg)  12/04/17 281 lb (127.5 kg)  04/03/17 296 lb (134.3 kg)    Physical Exam  Constitutional: She is oriented to person, place, and time. She appears well-developed and well-nourished.  HENT:    Head: Normocephalic and atraumatic.  Right Ear: Tympanic membrane, external ear and ear canal normal.  Left Ear: Tympanic membrane, external ear and ear canal normal.  Nose: Nose normal. No rhinorrhea.  Mouth/Throat: Mucous membranes are normal. Posterior oropharyngeal erythema present. No oropharyngeal exudate.  Eyes: Pupils are equal, round, and reactive to light. Conjunctivae and EOM are normal.  Neck: Normal range of motion. Neck supple.  Cardiovascular: Normal rate, regular rhythm, normal heart sounds and intact distal pulses.  Pulmonary/Chest: Effort normal and breath sounds normal.  Abdominal: Soft. Bowel sounds are normal.  Neurological: She is alert and oriented to person, place, and time. She has normal reflexes.  Skin: Skin is warm and dry. No rash noted.  Psychiatric: She has a normal mood and affect. Her behavior is normal. Judgment and thought content normal.        Assessment & Plan:   1. Paresthesia of both feet - gabapentin (NEURONTIN) 300 MG capsule; Take 1 capsule (300 mg total) by mouth 3 (three) times daily.  Dispense: 90 capsule; Refill: 3  2. Thrush - fluconazole (DIFLUCAN) 150 MG tablet; One daily one week, then one weekly  Dispense: 10 tablet; Refill: 6  3. Chronic bilateral thoracic back pain - traMADol (ULTRAM) 50 MG tablet; Take 1-2 tablets (50-100 mg total) by mouth every 8 (eight) hours as needed.  Dispense: 45 tablet; Refill: 2  4. Essential hypertension, benign - Lipid panel - CBC with Differential/Platelet - Thyroid Panel With TSH - CMP14+EGFR  5. Diffuse large B-cell lymphoma of lymph nodes of neck (HCC) - Lipid panel - CBC with Differential/Platelet - Thyroid Panel With TSH - CMP14+EGFR  6. Thyroid enlargement - Thyroid Panel With TSH  7. Encounter for immunization - Flu vaccine HIGH DOSE PF  8. Urinary frequency - Urinalysis, Complete - Urine Culture   Continue all other maintenance medications as listed above.  Follow up  plan: Return in about 6 months (around 11/21/2018).  Educational handout given for Heath PA-C [facility] [address]  [place], [place] [address] [phone]   05/26/2018, 1:39 PM

## 2018-05-28 DIAGNOSIS — L900 Lichen sclerosus et atrophicus: Secondary | ICD-10-CM | POA: Diagnosis not present

## 2018-05-28 DIAGNOSIS — Z85828 Personal history of other malignant neoplasm of skin: Secondary | ICD-10-CM | POA: Diagnosis not present

## 2018-05-28 DIAGNOSIS — L309 Dermatitis, unspecified: Secondary | ICD-10-CM | POA: Diagnosis not present

## 2018-05-28 DIAGNOSIS — L308 Other specified dermatitis: Secondary | ICD-10-CM | POA: Diagnosis not present

## 2018-05-28 DIAGNOSIS — L719 Rosacea, unspecified: Secondary | ICD-10-CM | POA: Diagnosis not present

## 2018-06-13 ENCOUNTER — Ambulatory Visit: Payer: Medicare HMO | Admitting: Physician Assistant

## 2018-06-17 ENCOUNTER — Ambulatory Visit (HOSPITAL_BASED_OUTPATIENT_CLINIC_OR_DEPARTMENT_OTHER)
Admission: RE | Admit: 2018-06-17 | Discharge: 2018-06-17 | Disposition: A | Discharge status: Home / Self Care | Payer: Medicare Other | Source: Ambulatory Visit | Attending: Physician Assistant | Admitting: Physician Assistant

## 2018-06-17 ENCOUNTER — Other Ambulatory Visit (HOSPITAL_BASED_OUTPATIENT_CLINIC_OR_DEPARTMENT_OTHER): Payer: Self-pay | Admitting: Physician Assistant

## 2018-06-17 DIAGNOSIS — M79661 Pain in right lower leg: Secondary | ICD-10-CM

## 2018-06-20 ENCOUNTER — Other Ambulatory Visit: Payer: Self-pay | Admitting: Physician Assistant

## 2018-06-20 DIAGNOSIS — I10 Essential (primary) hypertension: Secondary | ICD-10-CM

## 2018-06-21 ENCOUNTER — Ambulatory Visit (HOSPITAL_BASED_OUTPATIENT_CLINIC_OR_DEPARTMENT_OTHER): Payer: Medicare Other

## 2018-06-30 DIAGNOSIS — C8330 Diffuse large B-cell lymphoma, unspecified site: Secondary | ICD-10-CM | POA: Diagnosis not present

## 2018-07-01 DIAGNOSIS — E041 Nontoxic single thyroid nodule: Secondary | ICD-10-CM | POA: Diagnosis not present

## 2018-07-01 DIAGNOSIS — Z79890 Hormone replacement therapy (postmenopausal): Secondary | ICD-10-CM | POA: Diagnosis not present

## 2018-07-01 DIAGNOSIS — E042 Nontoxic multinodular goiter: Secondary | ICD-10-CM | POA: Diagnosis not present

## 2018-07-01 DIAGNOSIS — E039 Hypothyroidism, unspecified: Secondary | ICD-10-CM | POA: Diagnosis not present

## 2018-07-01 DIAGNOSIS — E063 Autoimmune thyroiditis: Secondary | ICD-10-CM | POA: Diagnosis not present

## 2018-07-09 ENCOUNTER — Other Ambulatory Visit: Payer: Self-pay | Admitting: Physician Assistant

## 2018-07-29 ENCOUNTER — Ambulatory Visit (INDEPENDENT_AMBULATORY_CARE_PROVIDER_SITE_OTHER): Payer: Medicare HMO | Admitting: Family Medicine

## 2018-07-29 ENCOUNTER — Encounter: Payer: Self-pay | Admitting: Family Medicine

## 2018-07-29 VITALS — BP 138/90 | HR 92 | Temp 97.0°F | Ht 67.0 in | Wt 270.0 lb

## 2018-07-29 DIAGNOSIS — N3090 Cystitis, unspecified without hematuria: Secondary | ICD-10-CM | POA: Diagnosis not present

## 2018-07-29 DIAGNOSIS — R300 Dysuria: Secondary | ICD-10-CM | POA: Diagnosis not present

## 2018-07-29 LAB — URINALYSIS
Bilirubin, UA: NEGATIVE
Glucose, UA: NEGATIVE
NITRITE UA: NEGATIVE
PH UA: 6 (ref 5.0–7.5)
UUROB: 1 mg/dL (ref 0.2–1.0)

## 2018-07-29 MED ORDER — AMOXICILLIN 500 MG PO CAPS: 500.0000 mg | ORAL_CAPSULE | Freq: Three times a day (TID) | ORAL | 0 refills | Status: DC

## 2018-07-29 NOTE — Progress Notes (Signed)
Subjective:  Patient ID: Brianna Kelly, female    DOB: Sep 24, 1950  Age: 68 y.o. MRN: [id number]  CC: Urinary Tract Infection   HPI Brianna Kelly presents for burning with urination and frequency for about a week. Denies fever . Mild left flank pain. No nausea, vomiting.   Depression screen [facility] 2/9 05/23/2018 12/04/2017 04/03/2017  Decreased Interest 0 1 0  Down, Depressed, Hopeless 0 2 0  PHQ - 2 Score 0 3 0  Altered sleeping - 1 -  Tired, decreased energy - 1 -  Change in appetite - 1 -  Feeling bad or failure about yourself  - 0 -  Trouble concentrating - 0 -  Moving slowly or fidgety/restless - 0 -  Suicidal thoughts - 0 -  PHQ-9 Score - 6 -    History Brianna Kelly has a past medical history of Hyperlipidemia, Hypertension, Kidney failure (2009), Thyroid disease, and Vitiligo.   She has a past surgical history that includes Replacement total knee (Left, 2011); Cholecystectomy (2001); and Tubal ligation (1976).   Her family history includes Anxiety disorder in her mother; Cancer in her sister; Diabetes in her father; Lung disease in her mother; Obesity in her father.She reports that she has never smoked. She has never used smokeless tobacco. She reports that she does not drink alcohol or use drugs.    ROS Review of Systems  Constitutional: Negative for chills, diaphoresis and fever.  HENT: Negative for congestion.   Eyes: Negative for visual disturbance.  Respiratory: Negative for cough and shortness of breath.   Cardiovascular: Negative for chest pain and palpitations.  Gastrointestinal: Negative for constipation, diarrhea and nausea.  Genitourinary: Positive for dysuria, frequency and urgency. Negative for decreased urine volume, flank pain, hematuria, menstrual problem and pelvic pain.  Musculoskeletal: Negative for arthralgias and joint swelling.  Skin: Negative for rash.  Neurological: Negative for dizziness and numbness.    Objective:  BP 138/90   Pulse 92   Temp (!) 97  F (36.1 C) (Oral)   Ht 5\' 7"  (1.702 m)   Wt 270 lb (122.5 kg)   BMI 42.29 kg/m   BP Readings from Last 3 Encounters:  07/29/18 138/90  05/23/18 130/82  12/04/17 121/83    Wt Readings from Last 3 Encounters:  07/29/18 270 lb (122.5 kg)  05/23/18 266 lb 6 oz (120.8 kg)  12/04/17 281 lb (127.5 kg)     Physical Exam Constitutional:      Appearance: She is well-developed.  HENT:     Head: Normocephalic and atraumatic.  Cardiovascular:     Rate and Rhythm: Normal rate and regular rhythm.     Heart sounds: No murmur.  Pulmonary:     Effort: Pulmonary effort is normal.     Breath sounds: Normal breath sounds.  Abdominal:     General: Bowel sounds are normal.     Palpations: Abdomen is soft. There is no mass.     Tenderness: There is no abdominal tenderness. There is no guarding or rebound.  Musculoskeletal:        General: No tenderness.  Skin:    General: Skin is warm and dry.  Neurological:     Mental Status: She is alert and oriented to person, place, and time.  Psychiatric:        Behavior: Behavior normal.       Assessment & Plan:   Brianna Kelly was seen today for urinary tract infection.  Diagnoses and all orders for this visit:  Dysuria -  Urinalysis -     Urine Culture  Cystitis  Other orders -     amoxicillin (AMOXIL) 500 MG capsule; Take 1 capsule (500 mg total) by mouth 3 (three) times daily.       I have discontinued Kira Deam's sulfamethoxazole-trimethoprim. I am also having her start on amoxicillin. Additionally, I am having her maintain her clindamycin-benzoyl peroxide, clobetasol cream, fluocinonide ointment, albuterol, levothyroxine, gabapentin, fluconazole, traMADol, rosuvastatin, benazepril, and furosemide.  Allergies as of 07/29/2018      Reactions   Codeine    itching      Medication List       Accurate as of July 29, 2018  6:00 PM. Always use your most recent med list.        albuterol 108 (90 Base) MCG/ACT  inhaler Commonly known as:  PROVENTIL HFA;VENTOLIN HFA Inhale 2 puffs into the lungs every 6 (six) hours as needed for wheezing or shortness of breath.   amoxicillin 500 MG capsule Commonly known as:  AMOXIL Take 1 capsule (500 mg total) by mouth 3 (three) times daily.   benazepril 10 MG tablet Commonly known as:  LOTENSIN TAKE 1 TABLET BY MOUTH ONCE DAILY   clindamycin-benzoyl peroxide gel Commonly known as:  BENZACLIN   clobetasol cream 0.05 % Commonly known as:  TEMOVATE   fluconazole 150 MG tablet Commonly known as:  DIFLUCAN One daily one week, then one weekly   fluocinonide ointment 0.05 % Commonly known as:  LIDEX   furosemide 20 MG tablet Commonly known as:  LASIX TAKE 2 TABLETS BY MOUTH IN THE MORNING AND TAKE 1 TABLET AT 6PM   gabapentin 300 MG capsule Commonly known as:  NEURONTIN Take 1 capsule (300 mg total) by mouth 3 (three) times daily.   levothyroxine 88 MCG tablet Commonly known as:  SYNTHROID, LEVOTHROID Take 1 tablet (88 mcg total) by mouth daily before breakfast.   rosuvastatin 20 MG tablet Commonly known as:  CRESTOR TAKE 1 TABLET BY MOUTH ONCE DAILY   traMADol 50 MG tablet Commonly known as:  ULTRAM Take 1-2 tablets (50-100 mg total) by mouth every 8 (eight) hours as needed.        Follow-up: Return if symptoms worsen or fail to improve.  Claretta Fraise, M.D.

## 2018-07-31 LAB — URINE CULTURE

## 2018-09-19 ENCOUNTER — Encounter: Payer: Self-pay | Admitting: Family

## 2018-09-19 ENCOUNTER — Ambulatory Visit (INDEPENDENT_AMBULATORY_CARE_PROVIDER_SITE_OTHER): Payer: Medicare HMO | Admitting: Family

## 2018-09-19 VITALS — BP 153/102 | HR 84 | Temp 97.7°F | Ht 67.0 in | Wt 277.2 lb

## 2018-09-19 DIAGNOSIS — J020 Streptococcal pharyngitis: Secondary | ICD-10-CM

## 2018-09-19 DIAGNOSIS — J029 Acute pharyngitis, unspecified: Secondary | ICD-10-CM

## 2018-09-19 DIAGNOSIS — R6889 Other general symptoms and signs: Secondary | ICD-10-CM | POA: Diagnosis not present

## 2018-09-19 LAB — VERITOR FLU A/B WAIVED
Influenza A: NEGATIVE
Influenza B: NEGATIVE

## 2018-09-19 LAB — RAPID STREP SCREEN (MED CTR MEBANE ONLY): Strep Gp A Ag, IA W/Reflex: POSITIVE — AB

## 2018-09-19 MED ORDER — AMOXICILLIN 875 MG PO TABS: 875.0000 mg | ORAL_TABLET | Freq: Two times a day (BID) | ORAL | 0 refills | Status: DC

## 2018-09-19 NOTE — Patient Instructions (Signed)
Strep Throat    Strep throat is a bacterial infection of the throat. Your health care provider may call the infection tonsillitis or pharyngitis, depending on whether there is swelling in the tonsils or at the back of the throat. Strep throat is most common during the cold months of the year in children who are 5-67 years of age, but it can happen during any season in people of any age. This infection is spread from person to person (contagious) through coughing, sneezing, or close contact.  What are the causes?  Strep throat is caused by the bacteria called Streptococcus pyogenes.  What increases the risk?  This condition is more likely to develop in:  · People who spend time in crowded places where the infection can spread easily.  · People who have close contact with someone who has strep throat.  What are the signs or symptoms?  Symptoms of this condition include:  · Fever or chills.  · Redness, swelling, or pain in the tonsils or throat.  · Pain or difficulty when swallowing.  · White or yellow spots on the tonsils or throat.  · Swollen, tender glands in the neck or under the jaw.  · Red rash all over the body (rare).  How is this diagnosed?  This condition is diagnosed by performing a rapid strep test or by taking a swab of your throat (throat culture test). Results from a rapid strep test are usually ready in a few minutes, but throat culture test results are available after one or two days.  How is this treated?  This condition is treated with antibiotic medicine.  Follow these instructions at home:  Medicines  · Take over-the-counter and prescription medicines only as told by your health care provider.  · Take your antibiotic as told by your health care provider. Do not stop taking the antibiotic even if you start to feel better.  · Have family members who also have a sore throat or fever tested for strep throat. They may need antibiotics if they have the strep infection.  Eating and drinking  · Do not  share food, drinking cups, or personal items that could cause the infection to spread to other people.  · If swallowing is difficult, try eating soft foods until your sore throat feels better.  · Drink enough fluid to keep your urine clear or pale yellow.  General instructions  · Gargle with a salt-water mixture 3-4 times per day or as needed. To make a salt-water mixture, completely dissolve ½-1 tsp of salt in 1 cup of warm water.  · Make sure that all household members wash their hands well.  · Get plenty of rest.  · Stay home from school or work until you have been taking antibiotics for 24 hours.  · Keep all follow-up visits as told by your health care provider. This is important.  Contact a health care provider if:  · The glands in your neck continue to get bigger.  · You develop a rash, cough, or earache.  · You cough up a thick liquid that is green, yellow-brown, or bloody.  · You have pain or discomfort that does not get better with medicine.  · Your problems seem to be getting worse rather than better.  · You have a fever.  Get help right away if:  · You have new symptoms, such as vomiting, severe headache, stiff or painful neck, chest pain, or shortness of breath.  · You have severe throat   pain, drooling, or changes in your voice.  · You have swelling of the neck, or the skin on the neck becomes red and tender.  · You have signs of dehydration, such as fatigue, dry mouth, and decreased urination.  · You become increasingly sleepy, or you cannot wake up completely.  · Your joints become red or painful.  This information is not intended to replace advice given to you by your health care provider. Make sure you discuss any questions you have with your health care provider.  Document Released: 07/06/2000 Document Revised: 03/07/2016 Document Reviewed: 11/01/2014  Elsevier Interactive Patient Education © 2019 [employer]

## 2018-09-19 NOTE — Progress Notes (Signed)
Subjective:    Patient ID: Brianna Kelly, female    DOB: 11/10/1950, 68 y.o.   MRN: [id number]  Chief Complaint  Patient presents with  . Sore Throat  . Cough  . Generalized Body Aches  . Chills    Sore Throat   This is a new problem. The current episode started yesterday. The problem has been unchanged. There has been no fever. The pain is at a severity of 7/10. The pain is moderate. Associated symptoms include congestion, coughing, ear pain (right), headaches, a hoarse voice and trouble swallowing. She has tried acetaminophen for the symptoms. The treatment provided mild relief.  Cough  This is a new problem. The current episode started yesterday. The problem has been unchanged. The cough is non-productive. Associated symptoms include ear pain (right), headaches, myalgias, nasal congestion, postnasal drip and a sore throat. She has tried rest and OTC cough suppressant for the symptoms. The treatment provided mild relief.      Review of Systems  HENT: Positive for congestion, ear pain (right), hoarse voice, postnasal drip, sore throat and trouble swallowing.   Respiratory: Positive for cough.   Musculoskeletal: Positive for myalgias.  Neurological: Positive for headaches.  All other systems reviewed and are negative.      Objective:   Physical Exam Vitals signs reviewed.  Constitutional:      General: She is not in acute distress.    Appearance: She is well-developed. She is obese. She is ill-appearing.  HENT:     Head: Normocephalic and atraumatic.     Right Ear: External ear normal.     Mouth/Throat:     Pharynx: Posterior oropharyngeal erythema present.     Tonsils: Swelling: 2+ on the right.  Eyes:     Pupils: Pupils are equal, round, and reactive to light.  Neck:     Musculoskeletal: Normal range of motion and neck supple.     Thyroid: No thyromegaly.  Cardiovascular:     Rate and Rhythm: Normal rate and regular rhythm.     Heart sounds: Normal heart sounds. No  murmur.  Pulmonary:     Effort: Pulmonary effort is normal. No respiratory distress.     Breath sounds: Normal breath sounds. No wheezing.  Abdominal:     General: Bowel sounds are normal. There is no distension.     Palpations: Abdomen is soft.     Tenderness: There is no abdominal tenderness.  Musculoskeletal: Normal range of motion.        General: No tenderness.  Skin:    General: Skin is warm and dry.  Neurological:     Mental Status: She is alert and oriented to person, place, and time.     Cranial Nerves: No cranial nerve deficit.     Deep Tendon Reflexes: Reflexes are normal and symmetric.  Psychiatric:        Behavior: Behavior normal.        Thought Content: Thought content normal.        Judgment: Judgment normal.       BP (!) 153/102   Pulse 84   Temp 97.7 F (36.5 C) (Oral)   Ht 5\' 7"  (1.702 m)   Wt 277 lb 3.2 oz (125.7 kg)   BMI 43.42 kg/m      Assessment & Plan:  Brianna Kelly comes in today with chief complaint of Sore Throat; Cough; Generalized Body Aches; and Chills   Diagnosis and orders addressed:  1. Flu-like symptoms - Veritor Flu A/B [employer]  2. Sore throat - Rapid Strep Screen ([facility] ONLY)  3. Strep throat - Take meds as prescribed - Use a cool mist humidifier  -Use saline nose sprays frequently -Force fluids -For any cough or congestion  Use plain Mucinex- regular strength or max strength is fine -For fever or aces or pains- take tylenol or ibuprofen. -Throat lozenges if help -New toothbrush in 3 days RTO if symptoms worsen or do not improve, she has not taken doxycyline in several days. She will hold this while taking amoxicillin.  - amoxicillin (AMOXIL) 875 MG tablet; Take 1 tablet (875 mg total) by mouth 2 (two) times daily.  Dispense: 14 tablet; Refill: 0   Evelina Dun, FNP

## 2018-09-25 ENCOUNTER — Telehealth: Payer: Self-pay | Admitting: Family

## 2018-09-25 MED ORDER — AZITHROMYCIN 250 MG PO TABS: ORAL_TABLET | ORAL | 0 refills | Status: DC

## 2018-09-25 NOTE — Telephone Encounter (Signed)
Zpak Prescription sent to pharmacy. If problems continue will need to be seen.

## 2018-09-25 NOTE — Telephone Encounter (Signed)
Patient aware.

## 2018-09-25 NOTE — Telephone Encounter (Signed)
What symptoms do you have? Still congested and feels no better and weak. Seen Christy 2-28  How long have you been sick? Last Wednesday  Have you been seen for this problem? Yes 2-28 with Alyse Low  If your provider decides to give you a prescription, which pharmacy would you like for it to be sent to? [facility] in [place]   Patient informed that this information will be sent to the clinical staff for review and that they should receive a follow up call.

## 2018-10-02 DIAGNOSIS — G629 Polyneuropathy, unspecified: Secondary | ICD-10-CM | POA: Diagnosis not present

## 2018-10-02 DIAGNOSIS — C8330 Diffuse large B-cell lymphoma, unspecified site: Secondary | ICD-10-CM | POA: Diagnosis not present

## 2018-10-02 DIAGNOSIS — J029 Acute pharyngitis, unspecified: Secondary | ICD-10-CM | POA: Diagnosis not present

## 2018-10-02 DIAGNOSIS — R0989 Other specified symptoms and signs involving the circulatory and respiratory systems: Secondary | ICD-10-CM | POA: Diagnosis not present

## 2018-10-02 DIAGNOSIS — E041 Nontoxic single thyroid nodule: Secondary | ICD-10-CM | POA: Diagnosis not present

## 2018-10-16 ENCOUNTER — Other Ambulatory Visit: Payer: Self-pay | Admitting: Physician Assistant

## 2018-10-16 DIAGNOSIS — I10 Essential (primary) hypertension: Secondary | ICD-10-CM

## 2018-10-16 DIAGNOSIS — G8929 Other chronic pain: Secondary | ICD-10-CM

## 2018-10-16 DIAGNOSIS — M546 Pain in thoracic spine: Principal | ICD-10-CM

## 2018-10-21 ENCOUNTER — Other Ambulatory Visit: Payer: Self-pay | Admitting: Physician Assistant

## 2018-10-21 DIAGNOSIS — G8929 Other chronic pain: Secondary | ICD-10-CM

## 2018-10-21 DIAGNOSIS — I10 Essential (primary) hypertension: Secondary | ICD-10-CM

## 2018-10-21 DIAGNOSIS — R202 Paresthesia of skin: Secondary | ICD-10-CM

## 2018-10-21 DIAGNOSIS — M546 Pain in thoracic spine: Secondary | ICD-10-CM

## 2018-10-22 ENCOUNTER — Other Ambulatory Visit: Payer: Self-pay

## 2018-10-22 DIAGNOSIS — R202 Paresthesia of skin: Secondary | ICD-10-CM

## 2018-10-22 DIAGNOSIS — I10 Essential (primary) hypertension: Secondary | ICD-10-CM

## 2018-10-22 MED ORDER — GABAPENTIN 300 MG PO CAPS: 300.0000 mg | ORAL_CAPSULE | Freq: Three times a day (TID) | ORAL | 2 refills | Status: DC

## 2018-10-22 MED ORDER — FUROSEMIDE 20 MG PO TABS: ORAL_TABLET | ORAL | 0 refills | Status: DC

## 2018-11-24 ENCOUNTER — Ambulatory Visit (INDEPENDENT_AMBULATORY_CARE_PROVIDER_SITE_OTHER): Payer: Medicare HMO | Admitting: Physician Assistant

## 2018-11-24 ENCOUNTER — Encounter: Payer: Self-pay | Admitting: Physician Assistant

## 2018-11-24 ENCOUNTER — Other Ambulatory Visit: Payer: Self-pay

## 2018-11-24 DIAGNOSIS — E785 Hyperlipidemia, unspecified: Secondary | ICD-10-CM | POA: Diagnosis not present

## 2018-11-24 DIAGNOSIS — I10 Essential (primary) hypertension: Secondary | ICD-10-CM | POA: Diagnosis not present

## 2018-11-24 DIAGNOSIS — R202 Paresthesia of skin: Secondary | ICD-10-CM

## 2018-11-24 DIAGNOSIS — E039 Hypothyroidism, unspecified: Secondary | ICD-10-CM

## 2018-11-24 MED ORDER — FUROSEMIDE 20 MG PO TABS: ORAL_TABLET | ORAL | 1 refills | Status: DC

## 2018-11-24 MED ORDER — GABAPENTIN 400 MG PO CAPS: 400.0000 mg | ORAL_CAPSULE | Freq: Three times a day (TID) | ORAL | 2 refills | Status: DC

## 2018-11-24 MED ORDER — LEVOTHYROXINE SODIUM 88 MCG PO TABS: 88.0000 ug | ORAL_TABLET | Freq: Every day | ORAL | 3 refills | Status: DC

## 2018-11-24 MED ORDER — BENAZEPRIL HCL 10 MG PO TABS: 10.0000 mg | ORAL_TABLET | Freq: Every day | ORAL | 1 refills | Status: DC

## 2018-11-24 MED ORDER — ROSUVASTATIN CALCIUM 20 MG PO TABS: 20.0000 mg | ORAL_TABLET | Freq: Every day | ORAL | 1 refills | Status: DC

## 2018-11-24 NOTE — Progress Notes (Signed)
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 female calls for telephone consult today. Patient provides verbal consent for consult held via phone.  Patient is identified with 2 separate identifiers.  At this time the entire area is on COVID-19 social distancing and stay home orders are in place.  Patient is of higher risk and therefore we are performing this by a virtual method.  Location of patient: home Location of provider: [facility] Others present for call: no  This patient is for periodic recheck on her chronic medical conditions.  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  She had built up to 300 mg of gabapentin 3 times a day.  She states that it is not helping at all.  She states that her oncologist said that much should be working.  I have discussed with the patient that this can be titrated to a higher dose and we will work on doing this over the next few months.  She will go to 400 mg 3 times a day when I send the prescription in today.  She will need labs in the near future to check her cholesterol and thyroidism.  She does need refills on some of her medications.  She states her blood pressures have been under good control.   ROS: Per HPI  Allergies  Allergen Reactions  . Codeine     itching   Past Medical History:  Diagnosis Date  . Hyperlipidemia   . Hypertension   . Kidney failure 2009    kidney function  stable now  . Thyroid disease   . Vitiligo     Current Outpatient Medications:  .  albuterol (PROVENTIL HFA;VENTOLIN HFA) 108 (90 Base) MCG/ACT inhaler, Inhale 2 puffs into the lungs every 6 (six) hours as needed for wheezing or shortness of breath., Disp: 1 Inhaler, Rfl: 0 .  benazepril (LOTENSIN) 10 MG tablet, Take 1 tablet (10  mg total) by mouth daily., Disp: 90 tablet, Rfl: 1 .  clindamycin-benzoyl peroxide (BENZACLIN) gel, , Disp: , Rfl:  .  clobetasol cream (TEMOVATE) 0.05 %, , Disp: , Rfl:  .  fluconazole (DIFLUCAN) 150 MG tablet, One daily one week, then one weekly (Patient not taking: Reported on 09/19/2018), Disp: 10 tablet, Rfl: 6 .  fluocinonide ointment (LIDEX) 0.05 %, , Disp: , Rfl:  .  furosemide (LASIX) 20 MG tablet, TAKE 2 TABLETS BY MOUTH IN THE MORNING AND TAKE 1 TABLET AT 6PM, Disp: 270 tablet, Rfl: 1 .  gabapentin (NEURONTIN) 400 MG capsule, Take 1 capsule (400 mg total) by mouth 3 (three) times daily., Disp: 90 capsule, Rfl: 2 .  levothyroxine (SYNTHROID) 88 MCG tablet, Take 1 tablet (88 mcg total) by mouth daily before breakfast., Disp: 90 tablet, Rfl: 3 .  metroNIDAZOLE (METROGEL) 1 % gel, , Disp: , Rfl:  .  rosuvastatin (CRESTOR) 20 MG tablet, Take 1 tablet (20 mg total) by mouth daily., Disp: 90 tablet, Rfl: 1 .  traMADol (ULTRAM) 50 MG tablet, Take 1-2 tablets (50-100 mg total) by mouth every 8 (eight) hours as needed., Disp: 45 tablet, Rfl: 2  Assessment/ Plan: 68 y.o. female   1. Paresthesia of both feet - gabapentin (NEURONTIN) 400 MG capsule; Take 1 capsule (400 mg total) by mouth 3 (three) times  daily.  Dispense: 90 capsule; Refill: 2  2. Essential hypertension, benign - benazepril (LOTENSIN) 10 MG tablet; Take 1 tablet (10 mg total) by mouth daily.  Dispense: 90 tablet; Refill: 1 - furosemide (LASIX) 20 MG tablet; TAKE 2 TABLETS BY MOUTH IN THE MORNING AND TAKE 1 TABLET AT 6PM  Dispense: 270 tablet; Refill: 1 - CBC with Differential/Platelet; Future - CMP14+EGFR; Future - Lipid panel; Future - TSH; Future  3. Hyperlipidemia LDL goal <100 - Lipid panel; Future  4. Hypothyroidism, unspecified type - TSH; Future   Start time: 7:55 AM End time: 8:06 AM  Meds ordered this encounter  Medications  . gabapentin (NEURONTIN) 400 MG capsule    Sig: Take 1 capsule (400 mg total) by  mouth 3 (three) times daily.    Dispense:  90 capsule    Refill:  2    Order Specific Question:   Supervising Provider    Answer:   Janora Norlander [[id number]]  . benazepril (LOTENSIN) 10 MG tablet    Sig: Take 1 tablet (10 mg total) by mouth daily.    Dispense:  90 tablet    Refill:  1    Order Specific Question:   Supervising Provider    Answer:   Janora Norlander [[id number]]  . furosemide (LASIX) 20 MG tablet    Sig: TAKE 2 TABLETS BY MOUTH IN THE MORNING AND TAKE 1 TABLET AT 6PM    Dispense:  270 tablet    Refill:  1    Order Specific Question:   Supervising Provider    Answer:   Janora Norlander [[id number]]  . levothyroxine (SYNTHROID) 88 MCG tablet    Sig: Take 1 tablet (88 mcg total) by mouth daily before breakfast.    Dispense:  90 tablet    Refill:  3    Order Specific Question:   Supervising Provider    Answer:   Janora Norlander [[id number]]  . rosuvastatin (CRESTOR) 20 MG tablet    Sig: Take 1 tablet (20 mg total) by mouth daily.    Dispense:  90 tablet    Refill:  1    Order Specific Question:   Supervising Provider    Answer:   Janora Norlander [[id number]]    Particia Nearing PA-C [facility] [phone]

## 2018-11-26 ENCOUNTER — Other Ambulatory Visit: Payer: Self-pay | Admitting: Physician Assistant

## 2018-11-26 MED ORDER — LEVOTHYROXINE SODIUM 88 MCG PO TABS: 88.0000 ug | ORAL_TABLET | Freq: Every day | ORAL | 3 refills | Status: DC

## 2018-12-18 ENCOUNTER — Other Ambulatory Visit: Payer: Self-pay

## 2018-12-18 ENCOUNTER — Ambulatory Visit (INDEPENDENT_AMBULATORY_CARE_PROVIDER_SITE_OTHER): Payer: Medicare HMO | Admitting: *Deleted

## 2018-12-18 DIAGNOSIS — Z0000 Encounter for general adult medical examination without abnormal findings: Secondary | ICD-10-CM | POA: Diagnosis not present

## 2018-12-18 NOTE — Progress Notes (Signed)
MEDICARE ANNUAL WELLNESS VISIT  12/18/2018  Telephone Visit Disclaimer This Medicare AWV was conducted by telephone due to national recommendations for restrictions regarding the COVID-19 Pandemic (e.g. social distancing).  I verified, using two identifiers, that I am speaking with Brianna Kelly or their authorized healthcare agent. I discussed the limitations, risks, security, and privacy concerns of performing an evaluation and management service by telephone and the potential availability of an in-person appointment in the future. The patient expressed understanding and agreed to proceed.   Subjective:  Brianna Kelly is a 68 y.o. female patient of Brianna Sleeper, PA-C who had a Medicare Annual Wellness Visit today via telephone. Brianna Kelly is Retired and lives with their spouse. she has 2 children. she reports that she is socially active and does interact with friends/family regularly. she is not physically active and enjoys watching TV shows and movies.  Patient Care Team: Brianna Kelly as PCP - General (Physician Assistant)  Advanced Directives 12/18/2018 02/19/2014 02/05/2014  Does Patient Have a Medical Advance Directive? No Patient does not have advance directive Patient does not have advance directive  Would patient like information on creating a medical advance directive? No - Patient declined [facility] Utilization Over the Past 12 Months: # of hospitalizations or ER visits: 0 # of surgeries: 0  Review of Systems    Patient reports that her overall health is unchanged compared to last year.  Patient Reported Readings (BP, Pulse, CBG, Weight, etc) none  Review of Systems: Musculoskeletal ROS: positive for - joint pain  All other systems negative.  Pain Assessment Pain : 0-10 Pain Score: 5  Pain Type: Other (Comment)(arthritis) Pain Location: Other (Comment)(shoulder, arm, neck and knee) Pain Orientation: Other (Comment)(multiple joints) Pain Descriptors /  Indicators: Aching Pain Frequency: Intermittent Pain Relieving Factors: heating pad Effect of Pain on Daily Activities: she can still do daily activities it justs takes longer and she has to take frequent rest breaks  Pain Relieving Factors: heating pad  Current Medications & Allergies (verified) Allergies as of 12/18/2018      Reactions   Codeine    itching      Medication List       Accurate as of Dec 18, 2018 10:26 AM. If you have any questions, ask your nurse or doctor.        albuterol 108 (90 Base) MCG/ACT inhaler Commonly known as:  VENTOLIN HFA Inhale 2 puffs into the lungs every 6 (six) hours as needed for wheezing or shortness of breath.   benazepril 10 MG tablet Commonly known as:  LOTENSIN Take 1 tablet (10 mg total) by mouth daily.   clindamycin-benzoyl peroxide gel Commonly known as:  BENZACLIN   clobetasol cream 0.05 % Commonly known as:  TEMOVATE   fluconazole 150 MG tablet Commonly known as:  DIFLUCAN One daily one week, then one weekly   fluocinonide ointment 0.05 % Commonly known as:  LIDEX   furosemide 20 MG tablet Commonly known as:  LASIX TAKE 2 TABLETS BY MOUTH IN THE MORNING AND TAKE 1 TABLET AT 6PM   gabapentin 400 MG capsule Commonly known as:  NEURONTIN Take 1 capsule (400 mg total) by mouth 3 (three) times daily.   levothyroxine 88 MCG tablet Commonly known as:  SYNTHROID Take 1 tablet (88 mcg total) by mouth daily before breakfast. May change to EUTHROX   metroNIDAZOLE 1 % gel Commonly known as:  METROGEL   rosuvastatin 20 MG tablet Commonly  known as:  CRESTOR Take 1 tablet (20 mg total) by mouth daily.   traMADol 50 MG tablet Commonly known as:  ULTRAM Take 1-2 tablets (50-100 mg total) by mouth every 8 (eight) hours as needed.       History (reviewed): Past Medical History:  Diagnosis Date  . Allergy   . Cancer ([place]) 2018   large B Cell Lymphoma  . Hyperlipidemia   . Hypertension   . Kidney failure 2009     kidney function  stable now  . Thyroid disease   . Vitiligo    Past Surgical History:  Procedure Laterality Date  . CHOLECYSTECTOMY  2001  . REPLACEMENT TOTAL KNEE Left 2011  . TUBAL LIGATION  1976   Family History  Problem Relation Age of Onset  . Obesity Father        died having surgery to remove gallbladder  . Diabetes Father   . Cancer Sister        ovarian  . Lung disease Mother   . Anxiety disorder Mother   . Colon cancer Neg Hx    Social History   Socioeconomic History  . Marital status: Married    Spouse name: Brianna Marek "Richardson Landry"  . Number of children: 2  . Years of education: AA  . Highest education level: Associate degree: academic program  Occupational History    Employer: [employer]    Comment: [employer]  . Occupation: Retired  Scientific laboratory technician  . Financial resource strain: Not hard at all  . Food insecurity:    Worry: Never true    Inability: Never true  . Transportation needs:    Medical: No    Non-medical: No  Tobacco Use  . Smoking status: Never Smoker  . Smokeless tobacco: Never Used  Substance and Sexual Activity  . Alcohol use: No  . Drug use: No  . Sexual activity: Not Currently    Birth control/protection: Surgical  Lifestyle  . Physical activity:    Days per week: 0 days    Minutes per session: 0 min  . Stress: To some extent  Relationships  . Social connections:    Talks on phone: More than three times a week    Gets together: Once a week    Attends religious service: More than 4 times per year    Active member of club or organization: No    Attends meetings of clubs or organizations: Never    Relationship status: Married  Other Topics Concern  . Not on file  Social History Narrative   Pt lives at home with spouse.   Caffeine Use: Very little    Activities of Daily Living In your present state of health, do you have any difficulty performing the following activities: 12/18/2018  Hearing? Y  Comment pt worked around loud machines  for years and had yearly hearing tests up till 2015 and her right ear has some deficit but not enough for hearing aids per pt  Vision? N  Difficulty concentrating or making decisions? N  Walking or climbing stairs? Y  Comment due to history of total knee replacement and joint pain  Dressing or bathing? N  Doing errands, shopping? N  Preparing Food and eating ? N  Using the Toilet? N  In the past six months, have you accidently leaked urine? Y  Comment only when she coughs or sneezes  Do you have problems with loss of bowel control? N  Managing your Medications? N  Managing your Finances?  N  Housekeeping or managing your Housekeeping? N  Some recent data might be hidden    Patient Literacy How often do you need to have someone help you when you read instructions, pamphlets, or other written materials from your doctor or pharmacy?: 1 - Never What is the last grade level you completed in school?: Associates [employer] in college  Exercise Current Exercise Habits: The patient does not participate in regular exercise at present, Exercise limited by: orthopedic condition(s)  Diet Patient reports consuming 3 meals a day and 1 snack(s) a day Patient reports that her primary diet is: Regular Patient reports that she does have regular access to food.   Depression Screen PHQ 2/9 Scores 12/18/2018 09/19/2018 05/23/2018 12/04/2017 04/03/2017 05/16/2016 05/01/2016  PHQ - 2 Score 1 0 0 3 0 0 0  PHQ- 9 Score - - - 6 - - -     Fall Risk Fall Risk  12/18/2018 05/23/2018 12/04/2017 05/16/2016 05/01/2016  Falls in the past year? 1 0 No No Yes  Number falls in past yr: 0 - - - 1  Injury with Fall? 0 - - - No  Risk for fall due to : Impaired balance/gait - - - -  Follow up - - - - Education provided     Objective:  Brianna Kelly seemed alert and oriented and she participated appropriately during our telephone visit.  Blood Pressure Weight BMI  BP Readings from Last 3 Encounters:  09/19/18 (!) 153/102   07/29/18 138/90  05/23/18 130/82   Wt Readings from Last 3 Encounters:  09/19/18 277 lb 3.2 oz (125.7 kg)  07/29/18 270 lb (122.5 kg)  05/23/18 266 lb 6 oz (120.8 kg)   BMI Readings from Last 1 Encounters:  09/19/18 43.42 kg/m    *Unable to obtain current vital signs, weight, and BMI due to telephone visit type  Hearing/Vision  . Virda did not seem to have difficulty with hearing/understanding during the telephone conversation . Reports that she has not had a formal eye exam by an eye care professional within the past year . Reports that she has not had a formal hearing evaluation within the past year *Unable to fully assess hearing and vision during telephone visit type  Cognitive Function: 6CIT Screen 12/18/2018  What Year? 0 points  What month? 0 points  What time? 0 points  Count back from 20 0 points  Months in reverse 0 points  Repeat phrase 2 points  Total Score 2    Normal Cognitive Function Screening: Yes (Normal:0-7, Significant for Dysfunction: >8)  Immunization & Health Maintenance Record Immunization History  Administered Date(s) Administered  . Influenza, High Dose Seasonal PF 05/23/2018  . Influenza,inj,Quad PF,6+ Mos 04/03/2017  . Influenza-Unspecified 04/03/2017  . Pneumococcal Conjugate-13 05/23/2018    Health Maintenance  Topic Date Due  . DEXA SCAN  04/18/2017  . COLONOSCOPY  02/20/2019  . INFLUENZA VACCINE  02/21/2019  . PNA vac Low Risk Adult (2 of 2 - PPSV23) 05/24/2019  . TETANUS/TDAP  08/25/2019  . MAMMOGRAM  12/12/2019  . Hepatitis C Screening  Completed       Assessment  This is a routine wellness examination for Brianna Kelly.  Health Maintenance: Due or Overdue Health Maintenance Due  Topic Date Due  . DEXA SCAN  04/18/2017    Brianna Kelly does not need a referral for Community Assistance: Care Management:   no Social Work:    no Prescription Assistance:  no Nutrition/Diabetes Education:  no   Plan:  Personalized Goals  Goals Addressed            This Visit's Progress   . Patient Stated (pt-stated)       Pt would like to get back to water aerobics as soon as she can to increase her activity. Pt states she felt better when she was doing this.      Personalized Health Maintenance & Screening Recommendations  Td vaccine and Shingles   Lung Cancer Screening Recommended: no (Low Dose CT Chest recommended if Age 9-80 years, 30 pack-year currently smoking OR have quit w/in past 15 years) Hepatitis C Screening recommended: no HIV Screening recommended: no  Advanced Directives: Written information was not prepared per patient's request.  Referrals & Orders No orders of the defined types were placed in this encounter.   Follow-up Plan . Follow-up with Brianna Sleeper, PA-C as planned . Consider updating your TDAP and discuss the need for Shingles vaccine with your PCP at next visit.   I have personally reviewed and noted the following in the patient's chart:   . Medical and social history . Use of alcohol, tobacco or illicit drugs  . Current medications and supplements . Functional ability and status . Nutritional status . Physical activity . Advanced directives . List of other physicians . Hospitalizations, surgeries, and ER visits in previous 12 months . Vitals . Screenings to include cognitive, depression, and falls . Referrals and appointments  In addition, I have reviewed and discussed with Brianna Kelly certain preventive protocols, quality metrics, and best practice recommendations. A written personalized care plan for preventive services as well as general preventive health recommendations is available and can be mailed to the patient at her request.      Marylin Crosby  12/18/2018

## 2018-12-18 NOTE — Patient Instructions (Signed)
Preventive Care 36 Years and Older, Female Preventive care refers to lifestyle choices and visits with your health care provider that can promote health and wellness. What does preventive care include?  A yearly physical exam. This is also called an annual well check.  Dental exams once or twice a year.  Routine eye exams. Ask your health care provider how often you should have your eyes checked.  Personal lifestyle choices, including: ? Daily care of your teeth and gums. ? Regular physical activity. ? Eating a healthy diet. ? Avoiding tobacco and drug use. ? Limiting alcohol use. ? Practicing safe sex. ? Taking low-dose aspirin every day. ? Taking vitamin and mineral supplements as recommended by your health care provider. What happens during an annual well check? The services and screenings done by your health care provider during your annual well check will depend on your age, overall health, lifestyle risk factors, and family history of disease. Counseling Your health care provider may ask you questions about your:  Alcohol use.  Tobacco use.  Drug use.  Emotional well-being.  Home and relationship well-being.  Sexual activity.  Eating habits.  History of falls.  Memory and ability to understand (cognition).  Work and work Statistician.  Reproductive health.  Screening You may have the following tests or measurements:  Height, weight, and BMI.  Blood pressure.  Lipid and cholesterol levels. These may be checked every 5 years, or more frequently if you are over 65 years old.  Skin check.  Lung cancer screening. You may have this screening every year starting at age 103 if you have a 30-pack-year history of smoking and currently smoke or have quit within the past 15 years.  Colorectal cancer screening. All adults should have this screening starting at age 17 and continuing until age 60. You will have tests every 1-10 years, depending on your results and the  type of screening test. People at increased risk should start screening at an earlier age. Screening tests may include: ? Guaiac-based fecal occult blood testing. ? Fecal immunochemical test (FIT). ? Stool DNA test. ? Virtual colonoscopy. ? Sigmoidoscopy. During this test, a flexible tube with a tiny camera (sigmoidoscope) is used to examine your rectum and lower colon. The sigmoidoscope is inserted through your anus into your rectum and lower colon. ? Colonoscopy. During this test, a long, thin, flexible tube with a tiny camera (colonoscope) is used to examine your entire colon and rectum.  Hepatitis C blood test.  Hepatitis B blood test.  Sexually transmitted disease (STD) testing.  Diabetes screening. This is done by checking your blood sugar (glucose) after you have not eaten for a while (fasting). You may have this done every 1-3 years.  Bone density scan. This is done to screen for osteoporosis. You may have this done starting at age 24.  Mammogram. This may be done every 1-2 years. Talk to your health care provider about how often you should have regular mammograms. Talk with your health care provider about your test results, treatment options, and if necessary, the need for more tests. Vaccines Your health care provider may recommend certain vaccines, such as:  Influenza vaccine. This is recommended every year.  Tetanus, diphtheria, and acellular pertussis (Tdap, Td) vaccine. You may need a Td booster every 10 years.  Varicella vaccine. You may need this if you have not been vaccinated.  Zoster vaccine. You may need this after age 36.  Measles, mumps, and rubella (MMR) vaccine. You may need at least  one dose of MMR if you were born in 1957 or later. You may also need a second dose.  Pneumococcal 13-valent conjugate (PCV13) vaccine. One dose is recommended after age 24.  Pneumococcal polysaccharide (PPSV23) vaccine. One dose is recommended after age 24.  Meningococcal  vaccine. You may need this if you have certain conditions.  Hepatitis A vaccine. You may need this if you have certain conditions or if you travel or work in places where you may be exposed to hepatitis A.  Hepatitis B vaccine. You may need this if you have certain conditions or if you travel or work in places where you may be exposed to hepatitis B.  Haemophilus influenzae type b (Hib) vaccine. You may need this if you have certain conditions. Talk to your health care provider about which screenings and vaccines you need and how often you need them. This information is not intended to replace advice given to you by your health care provider. Make sure you discuss any questions you have with your health care provider. Document Released: 08/05/2015 Document Revised: 08/29/2017 Document Reviewed: 05/10/2015 Elsevier Interactive Patient Education  2019 [employer].

## 2018-12-26 DIAGNOSIS — H40033 Anatomical narrow angle, bilateral: Secondary | ICD-10-CM | POA: Diagnosis not present

## 2018-12-26 DIAGNOSIS — H2513 Age-related nuclear cataract, bilateral: Secondary | ICD-10-CM | POA: Diagnosis not present

## 2018-12-29 DIAGNOSIS — C8330 Diffuse large B-cell lymphoma, unspecified site: Secondary | ICD-10-CM | POA: Diagnosis not present

## 2018-12-29 DIAGNOSIS — R590 Localized enlarged lymph nodes: Secondary | ICD-10-CM | POA: Diagnosis not present

## 2018-12-29 DIAGNOSIS — E048 Other specified nontoxic goiter: Secondary | ICD-10-CM | POA: Diagnosis not present

## 2019-01-01 DIAGNOSIS — Z79899 Other long term (current) drug therapy: Secondary | ICD-10-CM | POA: Diagnosis not present

## 2019-01-01 DIAGNOSIS — E042 Nontoxic multinodular goiter: Secondary | ICD-10-CM | POA: Diagnosis not present

## 2019-01-01 DIAGNOSIS — C8330 Diffuse large B-cell lymphoma, unspecified site: Secondary | ICD-10-CM | POA: Diagnosis not present

## 2019-01-01 DIAGNOSIS — G629 Polyneuropathy, unspecified: Secondary | ICD-10-CM | POA: Diagnosis not present

## 2019-01-08 DIAGNOSIS — H02831 Dermatochalasis of right upper eyelid: Secondary | ICD-10-CM | POA: Diagnosis not present

## 2019-01-08 DIAGNOSIS — H53001 Unspecified amblyopia, right eye: Secondary | ICD-10-CM | POA: Diagnosis not present

## 2019-01-08 DIAGNOSIS — H2512 Age-related nuclear cataract, left eye: Secondary | ICD-10-CM | POA: Diagnosis not present

## 2019-01-08 DIAGNOSIS — H2513 Age-related nuclear cataract, bilateral: Secondary | ICD-10-CM | POA: Diagnosis not present

## 2019-01-08 DIAGNOSIS — H2511 Age-related nuclear cataract, right eye: Secondary | ICD-10-CM | POA: Diagnosis not present

## 2019-01-09 ENCOUNTER — Encounter: Payer: Self-pay | Admitting: Gastroenterology

## 2019-01-20 IMAGING — US US THYROID
1 series · 13 of 25 positions shown · non-contrast
Comparison: None.

CLINICAL DATA: Palpable abnormality. Thyroid enlargement on
physical examination. History of hypothyroidism.

EXAM:
THYROID ULTRASOUND
TECHNIQUE: Ultrasound examination of the thyroid gland and adjacent soft
tissues was performed.

[Series 1: us thyroid · 0.07mm/px · 13 of 65 slices shown]
[im 1/65]
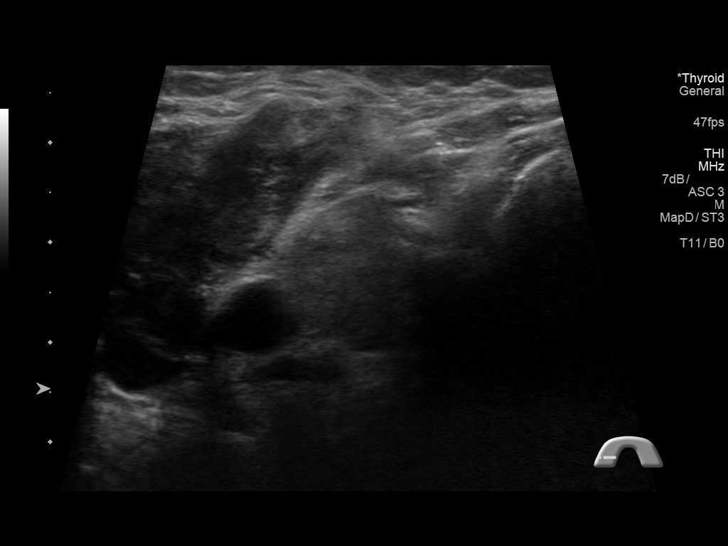
[im 6/65]
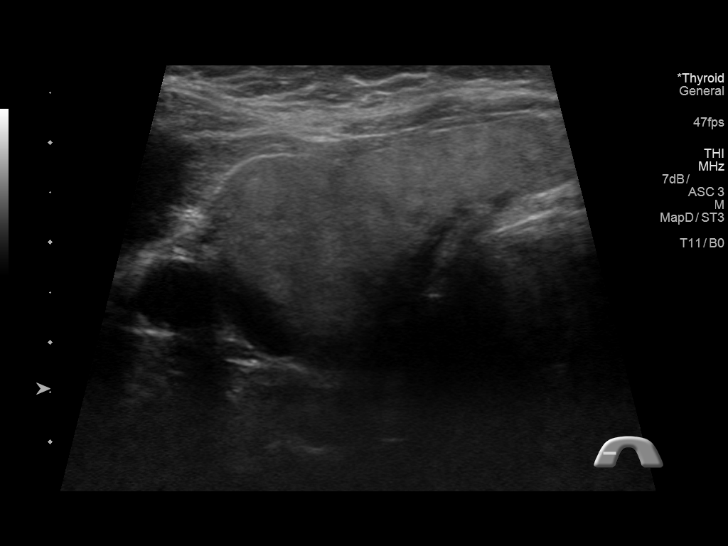
[im 11/65]
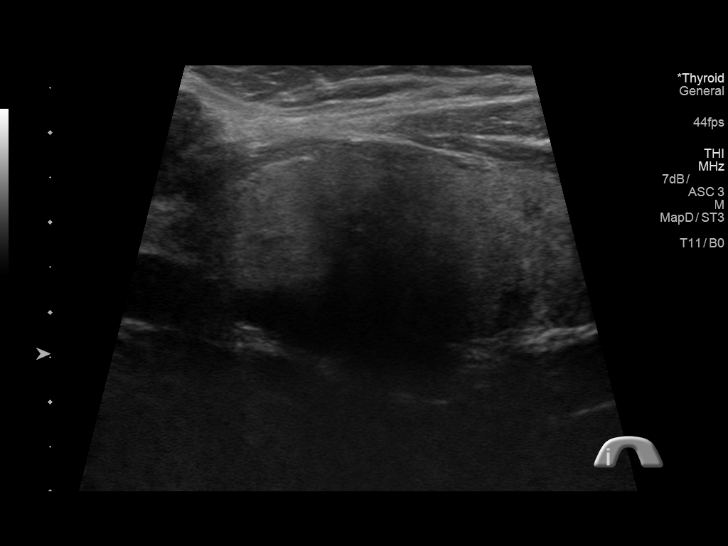
[im 17/65]
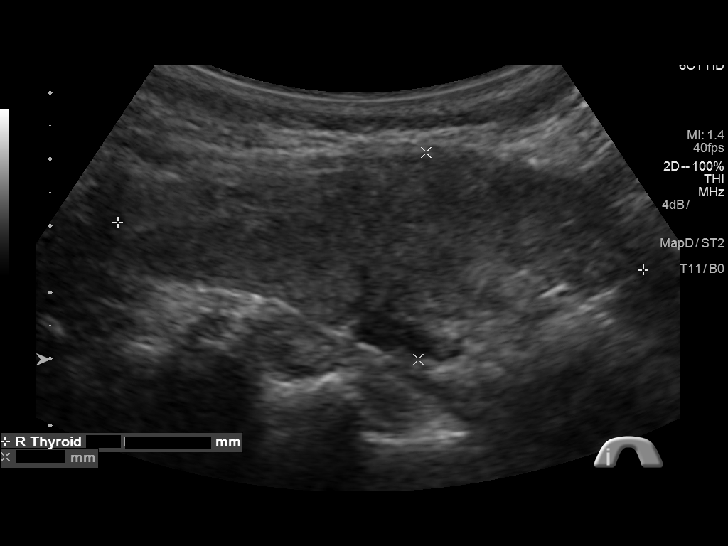
[im 22/65]
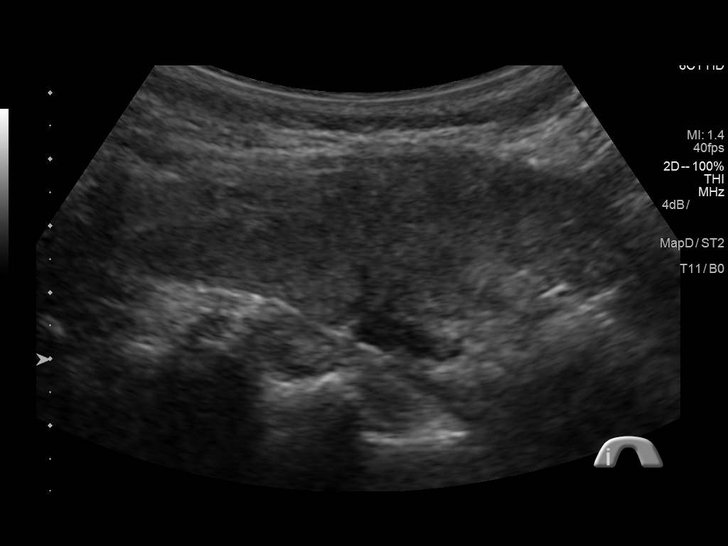
[im 27/65]
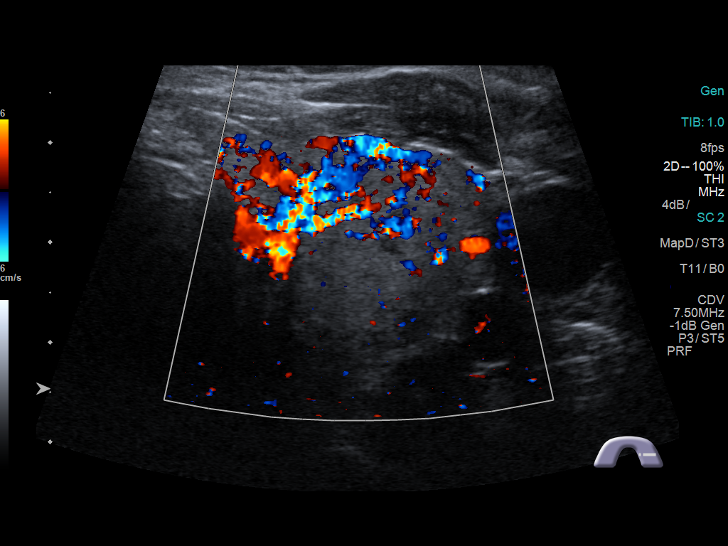
[im 33/65]
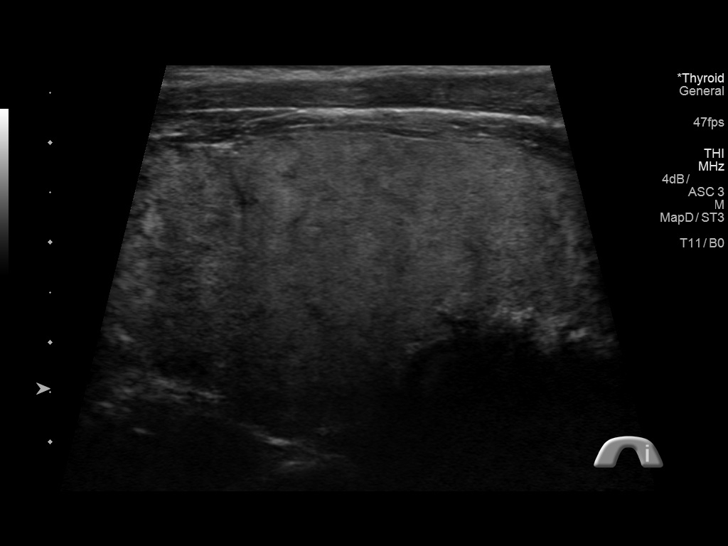
[im 38/65]
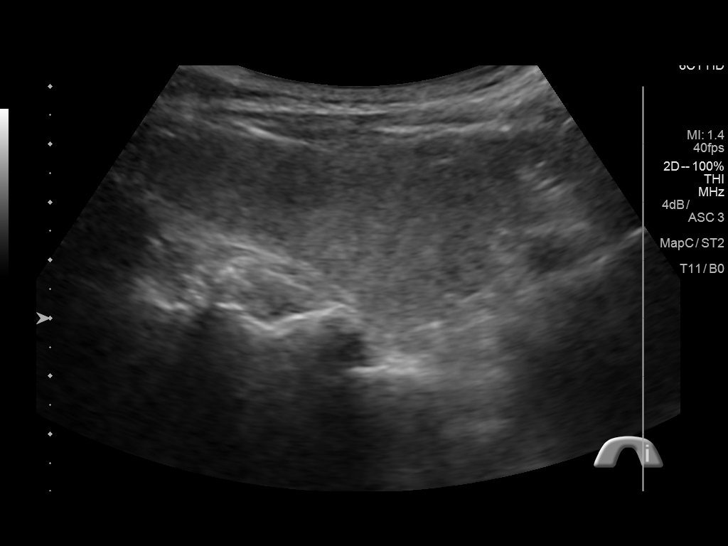
[im 43/65]
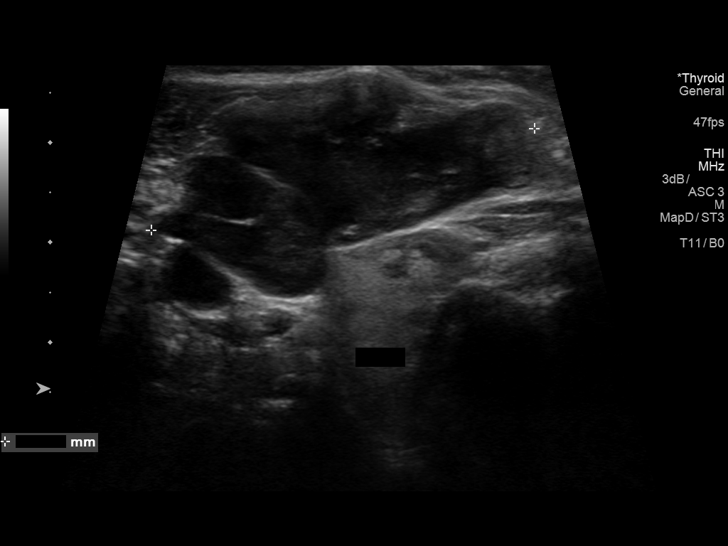
[im 49/65]
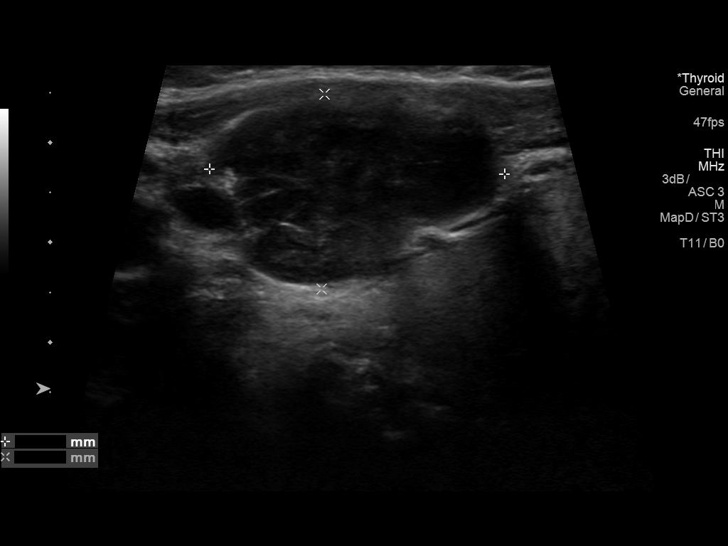
[im 54/65]
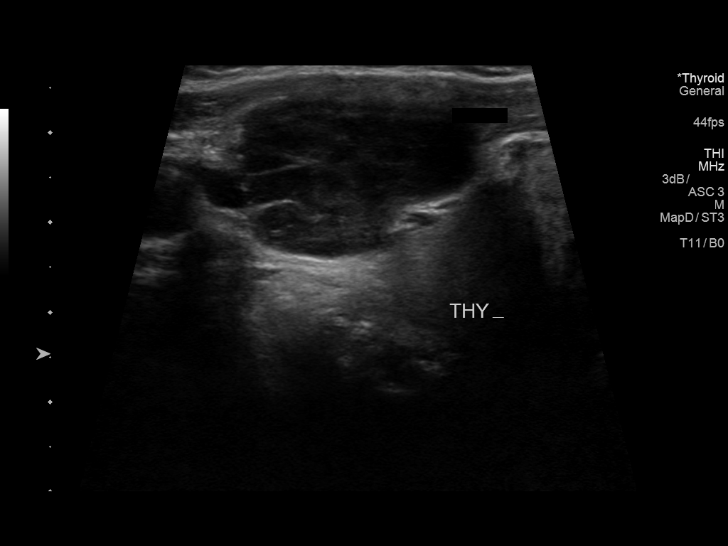
[im 59/65]
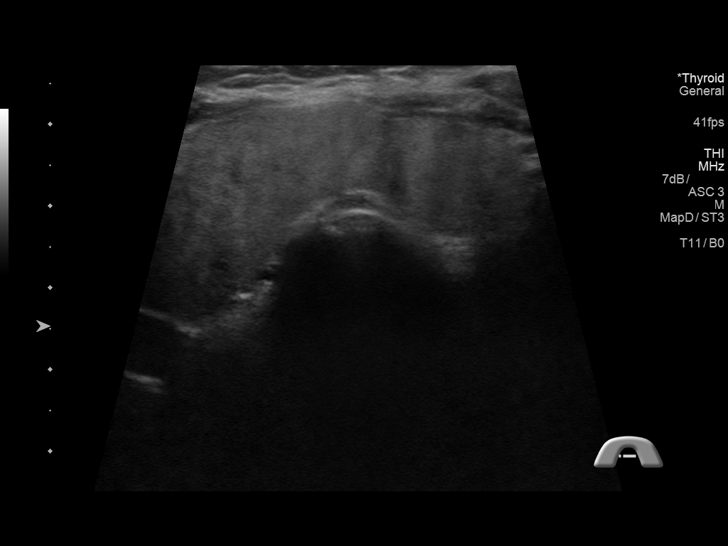
[im 65/65]
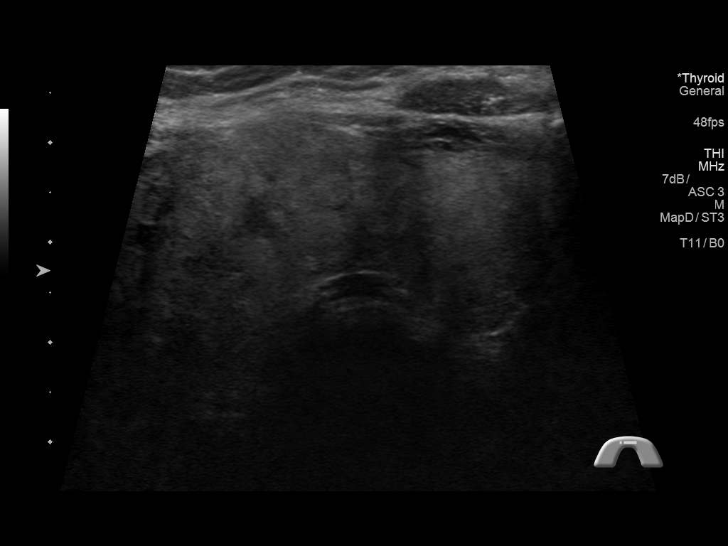

[13 of 25 positions shown; findings below may reference images not displayed]

FINDINGS: Parenchymal Echotexture: Mildly heterogenous

Isthmus: Enlarged measure 1.4 cm in diameter

Right lobe: Enlarged measuring 7.9 x 3.1 x 2.7 cm

Left lobe: Enlarged measuring 8.0 x 3.8 x 2.9 cm

_________________________________________________________

Estimated total number of nodules >/= 1 cm: 0

Number of spongiform nodules >/=  2 cm not described below (TR1): 0

Number of mixed cystic and solid nodules >/= 1.5 cm not described
below (TR2): 0

_________________________________________________________

No discrete nodules are seen within the thyroid gland.

The patient's palpable area of concern correlates with a
approximately 4.0 x 3.0 x 2.0 cm hypoechoic mass which while
nonspecific may represent a pathologically enlarged cervical lymph
node. This structure appears separate from the thyroid gland.
IMPRESSION: 1. Nonspecific thyromegaly without discrete nodule or mass.
2. Patient's palpable area of concern correlates with an
approximately 4 cm hypoechoic mass which while indeterminate on this
examination may be representative of a pathologically enlarged lymph
node. Further evaluation with contrast-enhanced neck CT could be
performed as clinically indicated.
These results will be called to the ordering clinician or
representative by the Radiologist Assistant, and communication
documented in the PACS or zVision Dashboard.

## 2019-01-27 DIAGNOSIS — Z01818 Encounter for other preprocedural examination: Secondary | ICD-10-CM | POA: Diagnosis not present

## 2019-01-27 DIAGNOSIS — H2511 Age-related nuclear cataract, right eye: Secondary | ICD-10-CM | POA: Diagnosis not present

## 2019-01-27 DIAGNOSIS — H2512 Age-related nuclear cataract, left eye: Secondary | ICD-10-CM | POA: Diagnosis not present

## 2019-01-27 DIAGNOSIS — H2513 Age-related nuclear cataract, bilateral: Secondary | ICD-10-CM | POA: Diagnosis not present

## 2019-01-29 DIAGNOSIS — Z1231 Encounter for screening mammogram for malignant neoplasm of breast: Secondary | ICD-10-CM | POA: Diagnosis not present

## 2019-02-06 ENCOUNTER — Other Ambulatory Visit: Payer: Self-pay | Admitting: Physician Assistant

## 2019-02-06 DIAGNOSIS — G8929 Other chronic pain: Secondary | ICD-10-CM

## 2019-02-19 DIAGNOSIS — H2511 Age-related nuclear cataract, right eye: Secondary | ICD-10-CM | POA: Diagnosis not present

## 2019-02-19 DIAGNOSIS — H25811 Combined forms of age-related cataract, right eye: Secondary | ICD-10-CM | POA: Diagnosis not present

## 2019-03-03 ENCOUNTER — Telehealth: Payer: Self-pay | Admitting: Physician Assistant

## 2019-03-03 NOTE — Chronic Care Management (AMB) (Signed)
°  Chronic Care Management   Outreach Note  03/03/2019 Name: Brianna Kelly MRN: [id number] DOB: 1950-11-25  Referred by: Terald Sleeper, PA-C Reason for referral : Chronic Care Management (Initial CCM outreach was unsuccessful. )   An unsuccessful telephone outreach was attempted today. The patient was referred to the case management team by for assistance with chronic care management and care coordination.   Follow Up Plan: A HIPPA compliant phone message was left for the patient providing contact information and requesting a return call.  The care management team will reach out to the patient again over the next 7 days.  If patient returns call to provider office, please advise to call [place] at [facility]  [email or web address]   ??[phone]

## 2019-03-03 NOTE — Chronic Care Management (AMB) (Signed)
Chronic Care Management   Note  03/03/2019 Name: Brianna Kelly MRN: [id number] DOB: 24-Feb-1951  Brianna Kelly is a 68 y.o. year old female who is a primary care patient of Terald Sleeper, PA-C. I reached out to Francie Massing by phone today in response to a referral sent by [name] health plan.    [name] was given information about Chronic Care Management services today including:  1. CCM service includes personalized support from designated clinical staff supervised by her physician, including individualized plan of care and coordination with other care providers 2. 24/7 contact phone numbers for assistance for urgent and routine care needs. 3. Service will only be billed when office clinical staff spend 20 minutes or more in a month to coordinate care. 4. Only one practitioner may furnish and bill the service in a calendar month. 5. The patient may stop CCM services at any time (effective at the end of the month) by phone call to the office staff. 6. The patient will be responsible for cost sharing (co-pay) of up to 20% of the service fee (after annual deductible is met).  Patient agreed to services and verbal consent obtained.   Follow up plan: Telephone appointment with CCM team member scheduled for: 03/31/2019  [place]  ??[email or web address]   ??[phone]

## 2019-03-06 DIAGNOSIS — H2512 Age-related nuclear cataract, left eye: Secondary | ICD-10-CM | POA: Diagnosis not present

## 2019-03-06 DIAGNOSIS — H25812 Combined forms of age-related cataract, left eye: Secondary | ICD-10-CM | POA: Diagnosis not present

## 2019-03-09 DIAGNOSIS — C8330 Diffuse large B-cell lymphoma, unspecified site: Secondary | ICD-10-CM | POA: Diagnosis not present

## 2019-03-09 DIAGNOSIS — Z452 Encounter for adjustment and management of vascular access device: Secondary | ICD-10-CM | POA: Diagnosis not present

## 2019-03-13 ENCOUNTER — Telehealth: Payer: Self-pay | Admitting: Physician Assistant

## 2019-03-13 DIAGNOSIS — H40033 Anatomical narrow angle, bilateral: Secondary | ICD-10-CM | POA: Diagnosis not present

## 2019-03-13 DIAGNOSIS — H2513 Age-related nuclear cataract, bilateral: Secondary | ICD-10-CM | POA: Diagnosis not present

## 2019-03-16 NOTE — Telephone Encounter (Signed)
Records was sent by ciox

## 2019-03-24 ENCOUNTER — Encounter: Payer: Self-pay | Admitting: Gastroenterology

## 2019-03-31 ENCOUNTER — Ambulatory Visit: Payer: Medicare HMO | Admitting: *Deleted

## 2019-03-31 DIAGNOSIS — M170 Bilateral primary osteoarthritis of knee: Secondary | ICD-10-CM

## 2019-03-31 DIAGNOSIS — E039 Hypothyroidism, unspecified: Secondary | ICD-10-CM

## 2019-03-31 DIAGNOSIS — C8331 Diffuse large B-cell lymphoma, lymph nodes of head, face, and neck: Secondary | ICD-10-CM

## 2019-03-31 DIAGNOSIS — I10 Essential (primary) hypertension: Secondary | ICD-10-CM

## 2019-03-31 DIAGNOSIS — E785 Hyperlipidemia, unspecified: Secondary | ICD-10-CM

## 2019-03-31 NOTE — Chronic Care Management (AMB) (Signed)
  Chronic Care Management   RNCM Initial Outreach Note  03/31/2019 Name: Brianna Kelly MRN: [id number] DOB: [birth year]  Referred by: Terald Sleeper, PA-C Reason for referral : Chronic Care Management (RNCM Initial Visit)   An unsuccessful telephone outreach was attempted today. The patient was referred to the case management team by for assistance with chronic care management and care coordination. She was contacted on 03/03/2019 by our CCM team and consented to services at that time. Today's visit was for an Initial RN Care Management telephone visit to discuss care coordination and self management of chronic medical conditions.   Follow Up Plan: A HIPPA compliant phone message was left for the patient providing contact information and requesting a return call.  The care management team will reach out to the patient again over the next 30 days.    Chong Sicilian BSN, RN-BC Embedded Chronic Care Manager [facility] [place] Management Direct Dial: [phone]

## 2019-04-14 ENCOUNTER — Encounter: Payer: Self-pay | Admitting: Gastroenterology

## 2019-04-14 ENCOUNTER — Other Ambulatory Visit: Payer: Self-pay

## 2019-04-14 ENCOUNTER — Ambulatory Visit (AMBULATORY_SURGERY_CENTER): Payer: Self-pay | Admitting: *Deleted

## 2019-04-14 VITALS — Temp 96.6°F | Ht 67.0 in | Wt 286.6 lb

## 2019-04-14 DIAGNOSIS — Z86010 Personal history of colonic polyps: Secondary | ICD-10-CM

## 2019-04-14 MED ORDER — PEG 3350-KCL-NA BICARB-NACL 420 G PO SOLR: 4000.0000 mL | Freq: Once | ORAL | 0 refills | Status: AC

## 2019-04-14 NOTE — Progress Notes (Signed)

## 2019-04-22 ENCOUNTER — Ambulatory Visit: Payer: Medicare HMO

## 2019-04-22 DIAGNOSIS — I10 Essential (primary) hypertension: Secondary | ICD-10-CM

## 2019-04-22 DIAGNOSIS — E785 Hyperlipidemia, unspecified: Secondary | ICD-10-CM

## 2019-04-22 NOTE — Chronic Care Management (AMB) (Signed)
Chronic Care Management    RNCM Initial Outreach Note   04/22/2019  Name: Brianna Kelly   MRN: [id number]       DOB: 01-27-1951   Referred by: Terald Sleeper, PA-C Reason for referral : Chronic Care Management (RNCM Initial Visit)   A 2nd unsuccessful telephone outreach for an Initial CCM Visit was attempted today. The patient was referred to the case management team by for assistance with chronic care management and care coordination. She was contacted on 03/03/2019 by our CCM team and consented to services at that time. Today's visit was for an Initial RN Care Management telephone visit to discuss care coordination and self management of chronic medical conditions.    Follow Up Plan: A HIPPA compliant phone message was left for the patient providing contact information and requesting a return call.  The care management team will reach out to the patient again over the next 30 days.      Chong Sicilian BSN, RN-BC Embedded Chronic Care Manager [facility] [place] Management Direct Dial: [phone]

## 2019-04-22 NOTE — Patient Instructions (Signed)
  Follow Up Plan: A HIPPA compliant phone message was left for the patient providing contact information and requesting a return call.  The care management team will reach out to the patient again over the next 30 days.      Chong Sicilian BSN, RN-BC Embedded Chronic Care Manager [facility] [place] Management Direct Dial: [phone]

## 2019-04-27 ENCOUNTER — Telehealth: Payer: Self-pay | Admitting: Gastroenterology

## 2019-04-27 NOTE — Telephone Encounter (Signed)

## 2019-04-28 ENCOUNTER — Ambulatory Visit (AMBULATORY_SURGERY_CENTER): Payer: Medicare HMO | Admitting: Gastroenterology

## 2019-04-28 ENCOUNTER — Encounter: Payer: Self-pay | Admitting: Gastroenterology

## 2019-04-28 ENCOUNTER — Other Ambulatory Visit: Payer: Self-pay

## 2019-04-28 VITALS — BP 100/67 | HR 78 | Temp 98.3°F | Resp 14 | Ht 67.0 in | Wt 286.0 lb

## 2019-04-28 DIAGNOSIS — D123 Benign neoplasm of transverse colon: Secondary | ICD-10-CM | POA: Diagnosis not present

## 2019-04-28 DIAGNOSIS — Z86010 Personal history of colonic polyps: Secondary | ICD-10-CM | POA: Diagnosis not present

## 2019-04-28 DIAGNOSIS — D122 Benign neoplasm of ascending colon: Secondary | ICD-10-CM | POA: Diagnosis not present

## 2019-04-28 DIAGNOSIS — E785 Hyperlipidemia, unspecified: Secondary | ICD-10-CM | POA: Diagnosis not present

## 2019-04-28 DIAGNOSIS — I10 Essential (primary) hypertension: Secondary | ICD-10-CM | POA: Diagnosis not present

## 2019-04-28 MED ORDER — SODIUM CHLORIDE 0.9 % IV SOLN: 500.0000 mL | INTRAVENOUS | Status: DC

## 2019-04-28 NOTE — Progress Notes (Signed)
Called to room to assist during endoscopic procedure.  Patient ID and intended procedure confirmed with present staff. Received instructions for my participation in the procedure from the performing physician.  

## 2019-04-28 NOTE — Op Note (Signed)
[place] Patient Name: Brianna Kelly Procedure Date: 04/28/2019 9:07 AM MRN: [id number] Endoscopist: Milus Banister , MD Age: 68 Referring MD:  Date of Birth: 18-Jun-1951 Gender: Female Account #: [id number] Procedure:                Colonoscopy Indications:              High risk colon cancer surveillance: Personal                            history of colonic polyps; colonoscopy 2015 two                            subCM SSPs Medicines:                Monitored Anesthesia Care Procedure:                Pre-Anesthesia Assessment:                           - Prior to the procedure, a History and Physical                            was performed, and patient medications and                            allergies were reviewed. The patient's tolerance of                            previous anesthesia was also reviewed. The risks                            and benefits of the procedure and the sedation                            options and risks were discussed with the patient.                            All questions were answered, and informed consent                            was obtained. Prior Anticoagulants: The patient has                            taken no previous anticoagulant or antiplatelet                            agents. ASA Grade Assessment: III - A patient with                            severe systemic disease. After reviewing the risks                            and benefits, the patient was deemed in  satisfactory condition to undergo the procedure.                           After obtaining informed consent, the colonoscope                            was passed under direct vision. Throughout the                            procedure, the patient's blood pressure, pulse, and                            oxygen saturations were monitored continuously. The                            Colonoscope was introduced through the anus and                         advanced to the the cecum, identified by                            appendiceal orifice and ileocecal valve. The                            colonoscopy was performed without difficulty. The                            patient tolerated the procedure well. The quality                            of the bowel preparation was good. The ileocecal                            valve, appendiceal orifice, and rectum were                            photographed. Scope In: 9:16:23 AM Scope Out: 9:36:01 AM Scope Withdrawal Time: 0 hours 12 minutes 24 seconds  Total Procedure Duration: 0 hours 19 minutes 38 seconds  Findings:                 A 4 mm polyp was found in the ascending colon. The                            polyp was sessile. The polyp was removed with a                            cold snare. Resection and retrieval were complete.                           A 12 mm polyp was found in the transverse colon.                            The polyp was sessile. The polyp was removed with a  cold snare. Resection and retrieval were complete.                           Left colon diverticulosis.                           The exam was otherwise without abnormality on                            direct and retroflexion views. Complications:            No immediate complications. Estimated blood loss:                            None. Estimated Blood Loss:     Estimated blood loss: none. Impression:               - One 4 mm polyp in the ascending colon, removed                            with a cold snare. Resected and retrieved. Jar 1                           - One 12 mm polyp in the transverse colon, removed                            with a cold snare. Resected and retrieved. Jar 2                           - Left colon diverticulosis.                           - The examination was otherwise normal on direct                            and retroflexion  views. Recommendation:           - Patient has a contact number available for                            emergencies. The signs and symptoms of potential                            delayed complications were discussed with the                            patient. Return to normal activities tomorrow.                            Written discharge instructions were provided to the                            patient.                           - Resume previous diet.                           -  Continue present medications.                           - Await pathology results. Milus Banister, MD 04/28/2019 9:39:05 AM This report has been signed electronically.

## 2019-04-28 NOTE — Patient Instructions (Signed)
   INFORMATION ON POLYPS & DIVERTICULOSIS  GIVEN TO YOU TODAY  AWAIT PATHOLOGY RESULTS ON POLYPS REMOVED    YOU HAD AN ENDOSCOPIC PROCEDURE TODAY AT [facility]:   Refer to the procedure report that was given to you for any specific questions about what was found during the examination.  If the procedure report does not answer your questions, please call your gastroenterologist to clarify.  If you requested that your care partner not be given the details of your procedure findings, then the procedure report has been included in a sealed envelope for you to review at your convenience later.  YOU SHOULD EXPECT: Some feelings of bloating in the abdomen. Passage of more gas than usual.  Walking can help get rid of the air that was put into your GI tract during the procedure and reduce the bloating. If you had a lower endoscopy (such as a colonoscopy or flexible sigmoidoscopy) you may notice spotting of blood in your stool or on the toilet paper. If you underwent a bowel prep for your procedure, you may not have a normal bowel movement for a few days.  Please Note:  You might notice some irritation and congestion in your nose or some drainage.  This is from the oxygen used during your procedure.  There is no need for concern and it should clear up in a day or so.  SYMPTOMS TO REPORT IMMEDIATELY:   Following lower endoscopy (colonoscopy or flexible sigmoidoscopy):  Excessive amounts of blood in the stool  Significant tenderness or worsening of abdominal pains  Swelling of the abdomen that is new, acute  Fever of 100F or higher    For urgent or emergent issues, a gastroenterologist can be reached at any hour by calling [phone].   DIET:  We do recommend a small meal at first, but then you may proceed to your regular diet.  Drink plenty of fluids but you should avoid alcoholic beverages for 24 hours.  ACTIVITY:  You should plan to take it easy for the rest of today and you  should NOT DRIVE or use heavy machinery until tomorrow (because of the sedation medicines used during the test).    FOLLOW UP: Our staff will call the number listed on your records 48-72 hours following your procedure to check on you and address any questions or concerns that you may have regarding the information given to you following your procedure. If we do not reach you, we will leave a message.  We will attempt to reach you two times.  During this call, we will ask if you have developed any symptoms of COVID 19. If you develop any symptoms (ie: fever, flu-like symptoms, shortness of breath, cough etc.) before then, please call [phone].  If you test positive for Covid 19 in the 2 weeks post procedure, please call and report this information to [place].    If any biopsies were taken you will be contacted by phone or by letter within the next 1-3 weeks.  Please call us at [phone] if you have not heard about the biopsies in 3 weeks.    SIGNATURES/CONFIDENTIALITY: You and/or your care partner have signed paperwork which will be entered into your electronic medical record.  These signatures attest to the fact that that the information above on your After Visit Summary has been reviewed and is understood.  Full responsibility of the confidentiality of this discharge information lies with you and/or your care-partner.

## 2019-04-28 NOTE — Progress Notes (Signed)
Report to PACU, RN, vss, BBS= Clear.  

## 2019-04-30 ENCOUNTER — Telehealth: Payer: Self-pay

## 2019-04-30 ENCOUNTER — Telehealth: Payer: Self-pay | Admitting: *Deleted

## 2019-04-30 NOTE — Telephone Encounter (Signed)
LVM

## 2019-04-30 NOTE — Telephone Encounter (Signed)
  Follow up Call-  Call back number 04/28/2019  Post procedure Call Back phone  # [phone]  Permission to leave phone message Yes  Some recent data might be hidden     Patient questions:  Do you have a fever, pain , or abdominal swelling? No. Pain Score  0 *  Have you tolerated food without any problems? Yes.    Have you been able to return to your normal activities? Yes.    Do you have any questions about your discharge instructions: Diet   No. Medications  No. Follow up visit  No.  Do you have questions or concerns about your Care? No.  Actions: * If pain score is 4 or above: No action needed, pain <4.  Have you developed a fever since your procedure? no 2.   Have you had an respiratory symptoms (SOB or cough) since your procedure? no  3.   Have you tested positive for COVID 19 since your procedure no  4.   Have you had any family members/close contacts diagnosed with the COVID 19 since your procedure?  no   If yes to any of these questions please route to Joylene John, RN and Alphonsa Gin, Therapist, sports.

## 2019-05-03 ENCOUNTER — Other Ambulatory Visit: Payer: Self-pay | Admitting: Physician Assistant

## 2019-05-03 DIAGNOSIS — R202 Paresthesia of skin: Secondary | ICD-10-CM

## 2019-05-04 ENCOUNTER — Encounter: Payer: Self-pay | Admitting: Gastroenterology

## 2019-05-07 ENCOUNTER — Ambulatory Visit: Payer: Medicare HMO | Admitting: *Deleted

## 2019-05-07 DIAGNOSIS — I10 Essential (primary) hypertension: Secondary | ICD-10-CM

## 2019-05-07 DIAGNOSIS — E039 Hypothyroidism, unspecified: Secondary | ICD-10-CM

## 2019-05-07 DIAGNOSIS — M170 Bilateral primary osteoarthritis of knee: Secondary | ICD-10-CM

## 2019-05-11 NOTE — Patient Instructions (Addendum)
   Chronic Care Management  05/07/2019   Francie Massing,  I have been unable to reach you by telephone to discuss your health maintenance goals but I understand how busy and hectic life can be. I have listed some general information about Chronic Care Management Services below.    If you have Medicare and live with two or more chronic conditions like arthritis, diabetes, depression, or high blood pressure, Chronic Care Management services can help you connect the dots so you can spend more time doing what you love.   Services May Include: . At least 20 minutes a month of chronic care management services with a Nurse Care Manager and/or Licensed Clinical Social Worker . Personalized assistance from a dedicated health care professional who will work with you to create your care plan . Coordination of care between your pharmacy, specialists, testing centers, and more . Phone check-ins between visits to keep you on track . 24/7 emergency access to a health care professional . Expert assistance with setting and meeting your health goals  During your initial telephone contact by [facility] staff, you were given the following information on Medicare guidelines regarding Chronic Care Management Services. If you have any questions, please let me know.  1. CCM service include personalized support from designated clinical staff supervised by your physician, including individualized plans of care and coordination with other care providers 2. 24/7 contact phone numbers for assistance for urgent and routine care needs [facility] [phone], 24-Hr Nurse Line [phone], [facility] [phone]) 3. Service will only be billed when office clinical staff spend 20 minutes or more in a month to coordinate care. 4. Only one practitioner may furnish and bill the service in a calendar month. 5. The patient will be responsible for cost sharing (co-pay) of up to 20% of the service fee (after annual deductible is met). Most  Medicare Advantage Plans cover Chronic Care Management Services at 100%. Please ask if you have any questions about your coverage.  6. You have provided verbal consent for these services. You may stop CCM services at any time (effective at the end of the    month) by phone call to the office staff.  If you think you will benefit from Chronic Care Management services, please feel free to reach out to me at [phone]. I would love to work with you to develop a care plan for management of your chronic medical conditions.   Chong Sicilian, BSN, RN-BC Embedded Chronic Care Manager [facility] [place] Management Direct Dial: [phone]

## 2019-05-11 NOTE — Chronic Care Management (AMB) (Signed)
  Chronic Care Management   RN Initial Outreach  05/07/2019 Name: Brianna Kelly MRN: [id number] DOB: May 07, 1951  A 3rd unsuccessful outreach attempt to connect with [name] about CCM services was made today.   Follow up plan: RN will mail CCM information to the patient's home address.  Patient may reach out for CCM services if desired.    Chong Sicilian, BSN, RN-BC Embedded Chronic Care Manager [facility] [place] Management Direct Dial: [phone]

## 2019-05-18 DIAGNOSIS — C8330 Diffuse large B-cell lymphoma, unspecified site: Secondary | ICD-10-CM | POA: Diagnosis not present

## 2019-05-18 DIAGNOSIS — G629 Polyneuropathy, unspecified: Secondary | ICD-10-CM | POA: Diagnosis not present

## 2019-05-18 DIAGNOSIS — E042 Nontoxic multinodular goiter: Secondary | ICD-10-CM | POA: Diagnosis not present

## 2019-05-21 ENCOUNTER — Other Ambulatory Visit: Payer: Self-pay

## 2019-05-22 ENCOUNTER — Ambulatory Visit (INDEPENDENT_AMBULATORY_CARE_PROVIDER_SITE_OTHER): Payer: Medicare HMO

## 2019-05-22 DIAGNOSIS — Z23 Encounter for immunization: Secondary | ICD-10-CM | POA: Diagnosis not present

## 2019-05-26 DIAGNOSIS — E039 Hypothyroidism, unspecified: Secondary | ICD-10-CM | POA: Diagnosis not present

## 2019-05-26 DIAGNOSIS — E063 Autoimmune thyroiditis: Secondary | ICD-10-CM | POA: Diagnosis not present

## 2019-05-26 DIAGNOSIS — C8330 Diffuse large B-cell lymphoma, unspecified site: Secondary | ICD-10-CM | POA: Diagnosis not present

## 2019-05-26 DIAGNOSIS — E041 Nontoxic single thyroid nodule: Secondary | ICD-10-CM | POA: Diagnosis not present

## 2019-05-26 DIAGNOSIS — E042 Nontoxic multinodular goiter: Secondary | ICD-10-CM | POA: Diagnosis not present

## 2019-05-27 ENCOUNTER — Ambulatory Visit (INDEPENDENT_AMBULATORY_CARE_PROVIDER_SITE_OTHER): Payer: Medicare HMO | Admitting: Physician Assistant

## 2019-05-27 ENCOUNTER — Encounter: Payer: Self-pay | Admitting: Physician Assistant

## 2019-05-27 DIAGNOSIS — R202 Paresthesia of skin: Secondary | ICD-10-CM

## 2019-05-27 DIAGNOSIS — I10 Essential (primary) hypertension: Secondary | ICD-10-CM

## 2019-05-27 DIAGNOSIS — E039 Hypothyroidism, unspecified: Secondary | ICD-10-CM | POA: Diagnosis not present

## 2019-05-27 DIAGNOSIS — E785 Hyperlipidemia, unspecified: Secondary | ICD-10-CM

## 2019-05-27 MED ORDER — FUROSEMIDE 20 MG PO TABS: ORAL_TABLET | ORAL | 1 refills | Status: AC

## 2019-05-27 MED ORDER — GABAPENTIN 400 MG PO CAPS: 400.0000 mg | ORAL_CAPSULE | Freq: Three times a day (TID) | ORAL | 2 refills | Status: DC

## 2019-05-27 MED ORDER — ROSUVASTATIN CALCIUM 20 MG PO TABS: 20.0000 mg | ORAL_TABLET | Freq: Every day | ORAL | 1 refills | Status: AC

## 2019-05-27 NOTE — Progress Notes (Signed)
Telephone visit  Subjective: LH:TDSKAJG medical conditions PCP: Terald Sleeper, PA-C OTL:XBWIO Brianna Kelly is a 68 y.o. female calls for telephone consult today. Patient provides verbal consent for consult held via phone.  Patient is identified with 2 separate identifiers.  At this time the entire area is on COVID-19 social distancing and stay home orders are in place.  Patient is of higher risk and therefore we are performing this by a virtual method.  Location of patient: home Location of provider: HOME Others present for call: mo   This patient is having a recheck on her chronic medical conditions today.  We are doing this by telephone.  She is still under the care of hematology for her lymphedema.  She states that overall she is fairly stable.  Labs had been ordered back in May but she was never able to come in.  I am can update those labs and place them a standing order so that she will have them in the future.  She does have chronic conditions of hypothyroidism, hypertension, hyperlipidemia.  She also is experiencing lot of joint pain throughout the day.  She is not sure if this is due to her thyroid being more out of control or just some degenerative changes.  Also a possibility her chemotherapy that she has taken.   ROS: Per HPI  Allergies  Allergen Reactions  . Codeine     itching   Past Medical History:  Diagnosis Date  . Allergy   . Anemia    in the past   . Anxiety    due to COVID   . Arthritis    kness, fingers  back and neck   . Cancer ([place]) 2018   large B Cell Lymphoma  . Cataract    removed both eyes  . 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 Hyperlipidemia   . Hypertension   . Kidney failure 2009    kidney function  stable now  . 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 Thyroid disease   . Vitiligo     Current Outpatient Medications:  .  acetaminophen (TYLENOL) 500 MG  tablet, Take 500 mg by mouth every 6 (six) hours as needed., Disp: , Rfl:  .  albuterol (PROVENTIL HFA;VENTOLIN HFA) 108 (90 Base) MCG/ACT inhaler, Inhale 2 puffs into the lungs every 6 (six) hours as needed for wheezing or shortness of breath., Disp: 1 Inhaler, Rfl: 0 .  aspirin-sod bicarb-citric acid (ALKA-SELTZER) 325 MG TBEF tablet, Take 325 mg by mouth every 6 (six) hours as needed., Disp: , Rfl:  .  benazepril (LOTENSIN) 10 MG tablet, Take 1 tablet (10 mg total) by mouth daily. (Patient not taking: Reported on 04/28/2019), Disp: 90 tablet, Rfl: 1 .  clindamycin-benzoyl peroxide (BENZACLIN) gel, , Disp: , Rfl:  .  clobetasol cream (TEMOVATE) 0.05 %, , Disp: , Rfl:  .  econazole nitrate 1 % cream, APPLY TO GROIN TWICE DAILY FOR 4 WEEKS THEN STOP, Disp: , Rfl:  .  fluconazole (DIFLUCAN) 150 MG tablet, One daily one week, then one weekly (Patient not taking: Reported on 04/28/2019), Disp: 10 tablet, Rfl: 6 .  fluocinonide ointment (LIDEX) 0.05 %, , Disp: , Rfl:  .  furosemide (LASIX) 20 MG tablet, TAKE 2 TABLETS BY MOUTH IN THE MORNING AND TAKE 1 TABLET AT 6PM, Disp: 270 tablet, Rfl: 1 .  gabapentin (NEURONTIN) 400  MG capsule, Take 1 capsule (400 mg total) by mouth 3 (three) times daily., Disp: 90 capsule, Rfl: 2 .  levothyroxine (SYNTHROID) 88 MCG tablet, Take 1 tablet (88 mcg total) by mouth daily before breakfast. May change to EUTHROX, Disp: 90 tablet, Rfl: 3 .  metroNIDAZOLE (METROGEL) 1 % gel, , Disp: , Rfl:  .  PRESCRIPTION MEDICATION, daily. Euthrox 88 mcg daily for thyroid, Disp: , Rfl:  .  rosuvastatin (CRESTOR) 20 MG tablet, Take 1 tablet (20 mg total) by mouth daily., Disp: 90 tablet, Rfl: 1 .  traMADol (ULTRAM) 50 MG tablet, TAKE 1 TO 2 TABLETS BY MOUTH EVERY 8 HOURS AS NEEDED, Disp: 45 tablet, Rfl: 0  Assessment/ Plan: 68 y.o. female   1. Essential hypertension, benign - CBC with Differential/Platelet; Standing - CMP14+EGFR; Standing - Lipid panel; Standing - TSH; Standing -  furosemide (LASIX) 20 MG tablet; TAKE 2 TABLETS BY MOUTH IN THE MORNING AND TAKE 1 TABLET AT 6PM  Dispense: 270 tablet; Refill: 1  2. Paresthesia of both feet - gabapentin (NEURONTIN) 400 MG capsule; Take 1 capsule (400 mg total) by mouth 3 (three) times daily.  Dispense: 90 capsule; Refill: 2  3. Hypothyroidism, unspecified type - CBC with Differential/Platelet; Standing - CMP14+EGFR; Standing - Lipid panel; Standing - TSH; Standing  4. Hyperlipidemia LDL goal <100 - CBC with Differential/Platelet; Standing - CMP14+EGFR; Standing - Lipid panel; Standing - TSH; Standing   Return in about 6 months (around 11/24/2019).  Continue all other maintenance medications as listed above.  Start time: 8:54 AM End time: 9:05 AM  Meds ordered this encounter  Medications  . furosemide (LASIX) 20 MG tablet    Sig: TAKE 2 TABLETS BY MOUTH IN THE MORNING AND TAKE 1 TABLET AT 6PM    Dispense:  270 tablet    Refill:  1    Order Specific Question:   Supervising Provider    Answer:   Janora Norlander [[id number]]  . gabapentin (NEURONTIN) 400 MG capsule    Sig: Take 1 capsule (400 mg total) by mouth 3 (three) times daily.    Dispense:  90 capsule    Refill:  2    Order Specific Question:   Supervising Provider    Answer:   Janora Norlander [[id number]]  . rosuvastatin (CRESTOR) 20 MG tablet    Sig: Take 1 tablet (20 mg total) by mouth daily.    Dispense:  90 tablet    Refill:  1    Order Specific Question:   Supervising Provider    Answer:   Janora Norlander [[id number]]    Brianna Nearing PA-C [place] [phone]

## 2019-05-28 ENCOUNTER — Other Ambulatory Visit: Payer: Self-pay

## 2019-05-28 ENCOUNTER — Other Ambulatory Visit: Payer: Medicare HMO

## 2019-05-28 DIAGNOSIS — I10 Essential (primary) hypertension: Secondary | ICD-10-CM | POA: Diagnosis not present

## 2019-05-28 DIAGNOSIS — E785 Hyperlipidemia, unspecified: Secondary | ICD-10-CM

## 2019-05-28 DIAGNOSIS — E039 Hypothyroidism, unspecified: Secondary | ICD-10-CM

## 2019-05-29 LAB — CMP14+EGFR
ALT: 23 IU/L (ref 0–32)
AST: 23 IU/L (ref 0–40)
Albumin/Globulin Ratio: 1.7 (ref 1.2–2.2)
Albumin: 4.3 g/dL (ref 3.8–4.8)
Alkaline Phosphatase: 138 IU/L — ABNORMAL HIGH (ref 39–117)
BUN/Creatinine Ratio: 13 (ref 12–28)
BUN: 12 mg/dL (ref 8–27)
Bilirubin Total: 0.6 mg/dL (ref 0.0–1.2)
CO2: 23 mmol/L (ref 20–29)
Calcium: 9.8 mg/dL (ref 8.7–10.3)
Chloride: 101 mmol/L (ref 96–106)
Creatinine, Ser: 0.94 mg/dL (ref 0.57–1.00)
GFR calc Af Amer: 73 mL/min/{1.73_m2} (ref >59)
GFR calc non Af Amer: 63 mL/min/{1.73_m2} (ref >59)
Globulin, Total: 2.5 g/dL (ref 1.5–4.5)
Glucose: 130 mg/dL — ABNORMAL HIGH (ref 65–99)
Potassium: 3.9 mmol/L (ref 3.5–5.2)
Sodium: 143 mmol/L (ref 134–144)
Total Protein: 6.8 g/dL (ref 6.0–8.5)

## 2019-05-29 LAB — CBC WITH DIFFERENTIAL/PLATELET
Basophils Absolute: 0.1 10*3/uL (ref 0.0–0.2)
Basos: 1 %
EOS (ABSOLUTE): 0.2 10*3/uL (ref 0.0–0.4)
Eos: 2 %
Hematocrit: 50.3 % — ABNORMAL HIGH (ref 34.0–46.6)
Hemoglobin: 15.9 g/dL (ref 11.1–15.9)
Immature Grans (Abs): 0 10*3/uL (ref 0.0–0.1)
Immature Granulocytes: 0 %
Lymphocytes Absolute: 3.2 10*3/uL — ABNORMAL HIGH (ref 0.7–3.1)
Lymphs: 33 %
MCH: 27.7 pg (ref 26.6–33.0)
MCHC: 31.6 g/dL (ref 31.5–35.7)
MCV: 88 fL (ref 79–97)
Monocytes Absolute: 0.7 10*3/uL (ref 0.1–0.9)
Monocytes: 7 %
Neutrophils Absolute: 5.5 10*3/uL (ref 1.4–7.0)
Neutrophils: 57 %
Platelets: 313 10*3/uL (ref 150–450)
RBC: 5.74 x10E6/uL — ABNORMAL HIGH (ref 3.77–5.28)
RDW: 13.9 % (ref 11.7–15.4)
WBC: 9.7 10*3/uL (ref 3.4–10.8)

## 2019-05-29 LAB — LIPID PANEL
Chol/HDL Ratio: 2.5 ratio (ref 0.0–4.4)
Cholesterol, Total: 131 mg/dL (ref 100–199)
HDL: 52 mg/dL (ref >39)
LDL Chol Calc (NIH): 49 mg/dL (ref 0–99)
Triglycerides: 184 mg/dL — ABNORMAL HIGH (ref 0–149)
VLDL Cholesterol Cal: 30 mg/dL (ref 5–40)

## 2019-05-29 LAB — TSH: TSH: 0.35 u[IU]/mL — ABNORMAL LOW (ref 0.450–4.500)

## 2019-06-01 ENCOUNTER — Other Ambulatory Visit: Payer: Self-pay | Admitting: Physician Assistant

## 2019-06-01 DIAGNOSIS — L900 Lichen sclerosus et atrophicus: Secondary | ICD-10-CM | POA: Diagnosis not present

## 2019-06-01 DIAGNOSIS — Z85828 Personal history of other malignant neoplasm of skin: Secondary | ICD-10-CM | POA: Diagnosis not present

## 2019-06-01 DIAGNOSIS — E039 Hypothyroidism, unspecified: Secondary | ICD-10-CM

## 2019-06-01 DIAGNOSIS — L249 Irritant contact dermatitis, unspecified cause: Secondary | ICD-10-CM | POA: Diagnosis not present

## 2019-06-01 DIAGNOSIS — L853 Xerosis cutis: Secondary | ICD-10-CM | POA: Diagnosis not present

## 2019-06-01 DIAGNOSIS — L719 Rosacea, unspecified: Secondary | ICD-10-CM | POA: Diagnosis not present

## 2019-06-01 DIAGNOSIS — L309 Dermatitis, unspecified: Secondary | ICD-10-CM | POA: Diagnosis not present

## 2019-06-01 DIAGNOSIS — L308 Other specified dermatitis: Secondary | ICD-10-CM | POA: Diagnosis not present

## 2019-06-01 MED ORDER — LEVOTHYROXINE SODIUM 75 MCG PO TABS: 75.0000 ug | ORAL_TABLET | Freq: Every day | ORAL | 1 refills | Status: DC

## 2019-07-03 ENCOUNTER — Other Ambulatory Visit: Payer: Self-pay | Admitting: Physician Assistant

## 2019-07-03 DIAGNOSIS — R202 Paresthesia of skin: Secondary | ICD-10-CM

## 2019-07-03 DIAGNOSIS — I10 Essential (primary) hypertension: Secondary | ICD-10-CM

## 2019-09-01 ENCOUNTER — Other Ambulatory Visit: Payer: Self-pay | Admitting: Physician Assistant

## 2019-09-01 DIAGNOSIS — B370 Candidal stomatitis: Secondary | ICD-10-CM

## 2019-09-30 ENCOUNTER — Other Ambulatory Visit: Payer: Self-pay | Admitting: Physician Assistant

## 2019-09-30 ENCOUNTER — Other Ambulatory Visit: Payer: Self-pay

## 2019-09-30 DIAGNOSIS — R202 Paresthesia of skin: Secondary | ICD-10-CM

## 2019-10-01 ENCOUNTER — Other Ambulatory Visit: Payer: Self-pay

## 2019-10-02 ENCOUNTER — Ambulatory Visit (INDEPENDENT_AMBULATORY_CARE_PROVIDER_SITE_OTHER): Payer: Medicare HMO | Admitting: Physician Assistant

## 2019-10-02 ENCOUNTER — Other Ambulatory Visit: Payer: Self-pay

## 2019-10-02 ENCOUNTER — Ambulatory Visit (INDEPENDENT_AMBULATORY_CARE_PROVIDER_SITE_OTHER): Payer: Medicare HMO

## 2019-10-02 ENCOUNTER — Encounter: Payer: Self-pay | Admitting: Physician Assistant

## 2019-10-02 VITALS — BP 143/88 | HR 87 | Temp 98.8°F | Ht 67.0 in | Wt 296.8 lb

## 2019-10-02 DIAGNOSIS — M2550 Pain in unspecified joint: Secondary | ICD-10-CM | POA: Diagnosis not present

## 2019-10-02 DIAGNOSIS — M50322 Other cervical disc degeneration at C5-C6 level: Secondary | ICD-10-CM | POA: Diagnosis not present

## 2019-10-02 DIAGNOSIS — E039 Hypothyroidism, unspecified: Secondary | ICD-10-CM

## 2019-10-02 DIAGNOSIS — M170 Bilateral primary osteoarthritis of knee: Secondary | ICD-10-CM

## 2019-10-02 DIAGNOSIS — M542 Cervicalgia: Secondary | ICD-10-CM

## 2019-10-02 DIAGNOSIS — M79642 Pain in left hand: Secondary | ICD-10-CM

## 2019-10-02 DIAGNOSIS — M25562 Pain in left knee: Secondary | ICD-10-CM | POA: Diagnosis not present

## 2019-10-02 DIAGNOSIS — Z96652 Presence of left artificial knee joint: Secondary | ICD-10-CM | POA: Diagnosis not present

## 2019-10-02 DIAGNOSIS — R202 Paresthesia of skin: Secondary | ICD-10-CM

## 2019-10-02 MED ORDER — LEVOTHYROXINE SODIUM 75 MCG PO TABS: 75.0000 ug | ORAL_TABLET | Freq: Every day | ORAL | 1 refills | Status: AC

## 2019-10-02 MED ORDER — GABAPENTIN 400 MG PO CAPS: 400.0000 mg | ORAL_CAPSULE | Freq: Three times a day (TID) | ORAL | 3 refills | Status: AC

## 2019-10-03 LAB — TSH: TSH: 2.64 u[IU]/mL (ref 0.450–4.500)

## 2019-10-06 NOTE — Progress Notes (Signed)
BP (!) 143/88   Pulse 87   Temp 98.8 F (37.1 C)   Ht 5\' 7"  (1.702 m)   Wt 296 lb 12.8 oz (134.6 kg)   SpO2 92%   BMI 46.49 kg/m    Subjective:    Patient ID: Brianna Kelly, female    DOB: 1950/09/24, 69 y.o.   MRN: [id number]  HPI 1. Paresthesia of both feet  2. Arthralgia, unspecified joint  3. Hypothyroidism, unspecified type  4. Primary osteoarthritis of both knees  5. Hand pain, left  6. Cervical pain   HPI: Brianna Kelly is a 69 y.o. female presenting on 10/02/2019 for Follow-up, Hypothyroidism, and Hyperlipidemia  This patient comes in for recheck on her chronic medical conditions which do include hypothyroidism, lipidemia, multiple joint pain few days she is having all over no clearance she has had had some issues in the past.  She has never had any significant surgeries.  The patient has gone through cancer this past couple years.  And she is used chemotherapy.  The question is possibly neuropathy is causing the pain versus different arthritis is.  Sure her knees and hands and neck are the most bothersome at this time.  We will have labs performed today  We will also have her see specialist for both the hand and pain management.  Referrals will in place.  Past Medical History:  Diagnosis Date  . Allergy   . Anemia    in the past   . Anxiety    due to COVID   . Arthritis    kness, fingers  back and neck   . Cancer ([place]) 2018   large B Cell Lymphoma  . Cataract    removed both eyes  . 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 Hyperlipidemia   . Hypertension   . Kidney failure 2009    kidney function  stable now  . 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 Thyroid disease   . Vitiligo    Relevant past medical, surgical, family and social history reviewed and updated as indicated. Interim medical history since our last visit reviewed. Allergies and medications reviewed and  updated. DATA REVIEWED: CHART IN EPIC  Family History reviewed for pertinent findings.  Review of Systems  Constitutional: Negative.   HENT: Negative.   Eyes: Negative.   Respiratory: Negative.   Gastrointestinal: Negative.   Genitourinary: Negative.   Musculoskeletal: Positive for arthralgias, back pain, joint swelling, myalgias, neck pain and neck stiffness.    Allergies as of 10/02/2019      Reactions   Codeine    itching      Medication List       Accurate as of October 02, 2019 11:59 PM. If you have any questions, ask your nurse or doctor.        STOP taking these medications   albuterol 108 (90 Base) MCG/ACT inhaler Commonly known as: VENTOLIN HFA Stopped by: Terald Sleeper, PA-C   potassium chloride SA 20 MEQ tablet Commonly known as: KLOR-CON Stopped by: Terald Sleeper, PA-C   PRESCRIPTION MEDICATION Stopped by: Terald Sleeper, PA-C   traMADol 50 MG tablet Commonly known as: ULTRAM Stopped by: Terald Sleeper, PA-C     TAKE these medications   acetaminophen 500 MG tablet Commonly known as: TYLENOL Take 500 mg by mouth every 6 (six) hours as needed.  aspirin-sod bicarb-citric acid 325 MG Tbef tablet Commonly known as: ALKA-SELTZER Take 325 mg by mouth every 6 (six) hours as needed.   benazepril 10 MG tablet Commonly known as: LOTENSIN Take 1 tablet by mouth once daily   clindamycin-benzoyl peroxide gel Commonly known as: BENZACLIN   clobetasol cream 0.05 % Commonly known as: TEMOVATE   Clobetasol Prop Emollient Base 0.05 % emollient cream APPLY TOPICALLY TO THE VAGINAL AREA ONCE DAILY AS NEEDED NOT TO FACE   econazole nitrate 1 % cream APPLY TO GROIN TWICE DAILY FOR 4 WEEKS THEN STOP   fluconazole 150 MG tablet Commonly known as: DIFLUCAN TAKE 1 TABLET BY MOUTH ONCE DAILY FOR 7 DAYS THEN TAKE 1 BY MOUTH ONCE A WEEK   fluocinonide ointment 0.05 % Commonly known as: LIDEX APPLY TO HANDS TWICE DAILY FOR 2 WEEKS, THEN DAILY FOR 2 WEEKS, THEN  REPEAT AS NEEDED. DO NOT APPLY TO FACE What changed: Another medication with the same name was removed. Continue taking this medication, and follow the directions you see here. Changed by: Terald Sleeper, PA-C   furosemide 20 MG tablet Commonly known as: LASIX TAKE 2 TABLETS BY MOUTH IN THE MORNING AND TAKE 1 TABLET AT 6PM   gabapentin 400 MG capsule Commonly known as: NEURONTIN Take 1 capsule (400 mg total) by mouth 3 (three) times daily. What changed:   medication strength  how much to take  Another medication with the same name was removed. Continue taking this medication, and follow the directions you see here. Changed by: Terald Sleeper, PA-C   levothyroxine 75 MCG tablet Commonly known as: SYNTHROID Take 1 tablet (75 mcg total) by mouth daily before breakfast. May change to [facility] What changed: Another medication with the same name was removed. Continue taking this medication, and follow the directions you see here. Changed by: Terald Sleeper, PA-C   metroNIDAZOLE 1 % gel Commonly known as: METROGEL What changed: Another medication with the same name was removed. Continue taking this medication, and follow the directions you see here. Changed by: Terald Sleeper, PA-C   rosuvastatin 20 MG tablet Commonly known as: CRESTOR Take 1 tablet (20 mg total) by mouth daily.   triamcinolone cream 0.1 % Commonly known as: KENALOG APPLY TO LEGS AND ARMS TWICE DAILY FOR 2 WEEKS, THEN DAILY FOR 2 WEEKS, THEN EVERY OTHER DAY AS NEEDED. NOT TO FACE          Objective:    BP (!) 143/88   Pulse 87   Temp 98.8 F (37.1 C)   Ht 5\' 7"  (1.702 m)   Wt 296 lb 12.8 oz (134.6 kg)   SpO2 92%   BMI 46.49 kg/m   Allergies  Allergen Reactions  . Codeine     itching    Wt Readings from Last 3 Encounters:  10/02/19 296 lb 12.8 oz (134.6 kg)  04/28/19 286 lb (129.7 kg)  04/14/19 286 lb 9.6 oz (130 kg)    Physical Exam Constitutional:      General: She is not in acute distress.     Appearance: Normal appearance. She is well-developed.  HENT:     Head: Normocephalic and atraumatic.  Cardiovascular:     Rate and Rhythm: Normal rate.  Pulmonary:     Effort: Pulmonary effort is normal.  Skin:    General: Skin is warm and dry.     Findings: No rash.  Neurological:     Mental Status: She is alert and oriented to  person, place, and time.     Deep Tendon Reflexes: Reflexes are normal and symmetric.     Results for orders placed or performed in visit on 10/02/19  TSH  Result Value Ref Range   TSH 2.640 0.450 - 4.500 uIU/mL      Assessment & Plan:   1. Paresthesia of both feet - gabapentin (NEURONTIN) 400 MG capsule; Take 1 capsule (400 mg total) by mouth 3 (three) times daily.  Dispense: 90 capsule; Refill: 3 - Ambulatory referral to Pain Clinic - Ambulatory referral to Rheumatology  2. Arthralgia, unspecified joint - Ambulatory referral to Pain Clinic - Ambulatory referral to Rheumatology  3. Hypothyroidism, unspecified type - TSH  4. Primary osteoarthritis of both knees - DG Knee 1-2 Views Left; Future - Ambulatory referral to Pain Clinic  5. Hand pain, left - DG Hand Complete Left; Future - Ambulatory referral to Pain Clinic - Ambulatory referral to Rheumatology  6. Cervical pain - DG Cervical Spine Complete; Future - Ambulatory referral to Pain Clinic - Ambulatory referral to Rheumatology   Continue all other maintenance medications as listed above.  Follow up plan: Return in about 3 months (around 01/02/2020) for recheck medications.  Educational handout given for [place] PA-C [facility] [address]  [place], [place] [address] [phone]   10/06/2019, 7:59 AM

## 2019-10-13 ENCOUNTER — Other Ambulatory Visit: Payer: Self-pay | Admitting: Physician Assistant

## 2019-10-13 DIAGNOSIS — M25561 Pain in right knee: Secondary | ICD-10-CM | POA: Diagnosis not present

## 2019-10-13 DIAGNOSIS — I10 Essential (primary) hypertension: Secondary | ICD-10-CM

## 2019-10-13 DIAGNOSIS — M25562 Pain in left knee: Secondary | ICD-10-CM | POA: Diagnosis not present

## 2019-10-13 DIAGNOSIS — M542 Cervicalgia: Secondary | ICD-10-CM | POA: Diagnosis not present

## 2019-10-13 DIAGNOSIS — G8929 Other chronic pain: Secondary | ICD-10-CM | POA: Diagnosis not present

## 2019-10-20 DIAGNOSIS — G8929 Other chronic pain: Secondary | ICD-10-CM | POA: Diagnosis not present

## 2019-10-20 DIAGNOSIS — C8598 Non-Hodgkin lymphoma, unspecified, lymph nodes of multiple sites: Secondary | ICD-10-CM | POA: Diagnosis not present

## 2019-10-20 DIAGNOSIS — M25562 Pain in left knee: Secondary | ICD-10-CM | POA: Diagnosis not present

## 2019-10-20 DIAGNOSIS — M25561 Pain in right knee: Secondary | ICD-10-CM | POA: Diagnosis not present

## 2019-10-20 DIAGNOSIS — Z79899 Other long term (current) drug therapy: Secondary | ICD-10-CM | POA: Diagnosis not present

## 2019-11-02 DIAGNOSIS — Z6841 Body Mass Index (BMI) 40.0 and over, adult: Secondary | ICD-10-CM | POA: Diagnosis not present

## 2019-11-02 DIAGNOSIS — M2550 Pain in unspecified joint: Secondary | ICD-10-CM | POA: Diagnosis not present

## 2019-11-02 DIAGNOSIS — R5382 Chronic fatigue, unspecified: Secondary | ICD-10-CM | POA: Diagnosis not present

## 2019-11-02 DIAGNOSIS — M792 Neuralgia and neuritis, unspecified: Secondary | ICD-10-CM | POA: Diagnosis not present

## 2019-11-03 DIAGNOSIS — Z79899 Other long term (current) drug therapy: Secondary | ICD-10-CM | POA: Diagnosis not present

## 2019-11-03 DIAGNOSIS — M25561 Pain in right knee: Secondary | ICD-10-CM | POA: Diagnosis not present

## 2019-11-03 DIAGNOSIS — G629 Polyneuropathy, unspecified: Secondary | ICD-10-CM | POA: Diagnosis not present

## 2019-11-03 DIAGNOSIS — G8929 Other chronic pain: Secondary | ICD-10-CM | POA: Diagnosis not present

## 2019-11-03 DIAGNOSIS — M25562 Pain in left knee: Secondary | ICD-10-CM | POA: Diagnosis not present

## 2019-11-17 ENCOUNTER — Ambulatory Visit: Payer: Medicare HMO | Admitting: Nurse Practitioner

## 2019-11-17 DIAGNOSIS — M25562 Pain in left knee: Secondary | ICD-10-CM | POA: Diagnosis not present

## 2019-11-17 DIAGNOSIS — M25561 Pain in right knee: Secondary | ICD-10-CM | POA: Diagnosis not present

## 2019-11-17 DIAGNOSIS — G8929 Other chronic pain: Secondary | ICD-10-CM | POA: Diagnosis not present

## 2019-11-17 DIAGNOSIS — G629 Polyneuropathy, unspecified: Secondary | ICD-10-CM | POA: Diagnosis not present

## 2019-11-17 DIAGNOSIS — Z79899 Other long term (current) drug therapy: Secondary | ICD-10-CM | POA: Diagnosis not present

## 2019-11-28 DIAGNOSIS — M542 Cervicalgia: Secondary | ICD-10-CM | POA: Diagnosis not present

## 2019-11-28 DIAGNOSIS — M25561 Pain in right knee: Secondary | ICD-10-CM | POA: Diagnosis not present

## 2019-11-28 DIAGNOSIS — M25562 Pain in left knee: Secondary | ICD-10-CM | POA: Diagnosis not present

## 2019-11-28 DIAGNOSIS — Z79899 Other long term (current) drug therapy: Secondary | ICD-10-CM | POA: Diagnosis not present

## 2019-11-28 DIAGNOSIS — G8929 Other chronic pain: Secondary | ICD-10-CM | POA: Diagnosis not present

## 2019-12-12 DIAGNOSIS — M129 Arthropathy, unspecified: Secondary | ICD-10-CM | POA: Diagnosis not present

## 2019-12-12 DIAGNOSIS — M25512 Pain in left shoulder: Secondary | ICD-10-CM | POA: Diagnosis not present

## 2019-12-12 DIAGNOSIS — M25562 Pain in left knee: Secondary | ICD-10-CM | POA: Diagnosis not present

## 2019-12-12 DIAGNOSIS — M25511 Pain in right shoulder: Secondary | ICD-10-CM | POA: Diagnosis not present

## 2019-12-12 DIAGNOSIS — R5383 Other fatigue: Secondary | ICD-10-CM | POA: Diagnosis not present

## 2019-12-12 DIAGNOSIS — E559 Vitamin D deficiency, unspecified: Secondary | ICD-10-CM | POA: Diagnosis not present

## 2019-12-12 DIAGNOSIS — M25561 Pain in right knee: Secondary | ICD-10-CM | POA: Diagnosis not present

## 2019-12-12 DIAGNOSIS — Z114 Encounter for screening for human immunodeficiency virus [HIV]: Secondary | ICD-10-CM | POA: Diagnosis not present

## 2019-12-12 DIAGNOSIS — E785 Hyperlipidemia, unspecified: Secondary | ICD-10-CM | POA: Diagnosis not present

## 2019-12-12 DIAGNOSIS — Z0000 Encounter for general adult medical examination without abnormal findings: Secondary | ICD-10-CM | POA: Diagnosis not present

## 2019-12-12 DIAGNOSIS — Z23 Encounter for immunization: Secondary | ICD-10-CM | POA: Diagnosis not present

## 2019-12-12 DIAGNOSIS — I10 Essential (primary) hypertension: Secondary | ICD-10-CM | POA: Diagnosis not present

## 2019-12-12 DIAGNOSIS — Z79899 Other long term (current) drug therapy: Secondary | ICD-10-CM | POA: Diagnosis not present

## 2019-12-12 DIAGNOSIS — R0602 Shortness of breath: Secondary | ICD-10-CM | POA: Diagnosis not present

## 2019-12-12 DIAGNOSIS — Z131 Encounter for screening for diabetes mellitus: Secondary | ICD-10-CM | POA: Diagnosis not present

## 2019-12-17 DIAGNOSIS — H2513 Age-related nuclear cataract, bilateral: Secondary | ICD-10-CM | POA: Diagnosis not present

## 2019-12-17 DIAGNOSIS — H40033 Anatomical narrow angle, bilateral: Secondary | ICD-10-CM | POA: Diagnosis not present

## 2020-01-19 ENCOUNTER — Telehealth: Payer: Self-pay | Admitting: Nurse Practitioner

## 2020-01-19 NOTE — Telephone Encounter (Signed)
I have never seen patient- needs to be seen for referral

## 2020-01-19 NOTE — Telephone Encounter (Signed)
  REFERRAL REQUEST Telephone Note 01/19/2020  What type of referral do you need? Physical Therapy Why do you need this referral? Sciatic Nerve pain  Have you been seen at our office for this problem? Yes - Sciatic Nerve  (Advise that they may need an appointment with their PCP before a referral can be done)  Is there a particular doctor or location that you prefer? [facility]  Patient notified that referrals can take up to a week or longer to process. If they haven't heard anything within a week they should call back and speak with the referral department.

## 2020-01-19 NOTE — Telephone Encounter (Signed)
Left message to please call our office to schedule with pcp to be evaluated for a referral.

## 2020-02-01 ENCOUNTER — Ambulatory Visit: Payer: Medicare HMO | Attending: Family Medicine | Admitting: Physical Therapy

## 2020-02-01 ENCOUNTER — Other Ambulatory Visit: Payer: Self-pay

## 2020-02-01 DIAGNOSIS — M545 Low back pain, unspecified: Secondary | ICD-10-CM

## 2020-02-01 DIAGNOSIS — R293 Abnormal posture: Secondary | ICD-10-CM | POA: Diagnosis present

## 2020-02-01 NOTE — Therapy (Signed)
[facility] [place], [place], [address] Phone: [phone]   Fax:  [phone]  Physical Therapy Evaluation  Patient Details  Name: Brianna Kelly MRN: [id number] Date of Birth: [birth year] Referring Provider (PT): Devin Going MD.   Encounter Date: 02/01/2020   PT End of Session - 02/01/20 0955    Visit Number 1    Number of Visits 12    Date for PT Re-Evaluation 03/21/20    PT Start Time 0900    PT Stop Time 0940    PT Time Calculation (min) 40 min    Activity Tolerance Patient tolerated treatment well    Behavior During Therapy [facility] for tasks assessed/performed           Past Medical History:  Diagnosis Date  . Allergy   . Anemia    in the past   . Anxiety    due to COVID   . Arthritis    kness, fingers  back and neck   . Cancer ([place]) 2018   large B Cell Lymphoma  . Cataract    removed both eyes  . 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 Hyperlipidemia   . Hypertension   . Kidney failure 2009    kidney function  stable now  . 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 Thyroid disease   . Vitiligo     Past Surgical History:  Procedure Laterality Date  . CATARACT EXTRACTION, BILATERAL Bilateral   . CHOLECYSTECTOMY  2001  . COLONOSCOPY    . KNEE SURGERY Right   . POLYPECTOMY    . PORT-A-CATH REMOVAL    . PORTA CATH INSERTION    . REPLACEMENT TOTAL KNEE Left 2011  . TUBAL LIGATION  1976    There were no vitals filed for this visit.    Subjective Assessment - 02/01/20 0929    Subjective COVID-19 screen performed prior to patient entering clinic.  The patient the onset of right sided low back pain about 4 weeks ago for no apparent reason.  She reports severe pain in her right low back region and occasional shooting down her right posterior thigh with certain movements.  She has been using heat a lot and has burned her  self a bit.  She has switiched to ice since she burned herself which makes her feel some better.  Movemrent increase pain.    Pertinent History Lymphoma (resolved), HTN, thyroid problem, kidney problem, OA.    How long can you sit comfortably? Varies.    How long can you stand comfortably? Varies.    How long can you walk comfortably? Short distances.    Patient Stated Goals Get out of pain.    Currently in Pain? Yes    Pain Score 10-Worst pain ever    Pain Location Back    Pain Orientation Right    Pain Descriptors / Indicators Sore;Shooting;Burning    Pain Type Acute pain    Pain Onset 1 to 4 weeks ago    Pain Frequency Constant    Aggravating Factors  See above.    Pain Relieving Factors See above.              [facility] PT Assessment - 02/01/20 0001      Assessment   Medical Diagnosis Acute right sided low back pain with sciatica    Referring Provider (PT) Devin Going  MD.    Onset Date/Surgical Date --   ~4 weeks.     Precautions   Precautions None      Restrictions   Weight Bearing Restrictions No      Balance Screen   Has the patient fallen in the past 6 months No    Has the patient had a decrease in activity level because of a fear of falling?  Yes    Is the patient reluctant to leave their home because of a fear of falling?  Yes      [place] residence      Prior Function   Level of Independence Independent      Observation/Other Assessments   Skin Integrity Burn in mid low and and mild burn/scratch in right low back region.    Focus on Therapeutic Outcomes (FOTO)  76% limitation.      Posture/Postural Control   Posture/Postural Control Postural limitations    Postural Limitations Flexed trunk      Deep Tendon Reflexes   DTR Assessment Site Patella;Achilles    Patella DTR --   LT 2+/4+, RT TKA (absent).   Achilles DTR 1+      ROM / Strength   AROM / PROM / Strength AROM;Strength      AROM   Overall AROM Comments  Active lumbar flexion decreased by 50% and extension is -10 degrees from neutral.      Strength   Overall Strength Comments Normal LE strength.      Palpation   Palpation comment Very tender to palpation over right SIJ and upper gluteal musculature.      Special Tests   Other special tests (-) Slump test.      Transfers   Comments Slow sit to stand transitions with definite use of armrests.      Ambulation/Gait   Gait Comments Very antalgic gait pattern.                      Objective measurements completed on examination: See above findings.       [facility] Adult PT Treatment/Exercise - 02/01/20 0001      Modalities   Modalities Electrical Stimulation      Electrical Stimulation   Electrical Stimulation Location Right SIJ/upper gluteal region.    Electrical Stimulation Action Pre-mod.    Electrical Stimulation Parameters 80-150 hz x 15 minutes.    Electrical Stimulation Goals Pain                       PT Long Term Goals - 02/01/20 1034      PT LONG TERM GOAL #1   Title Independent with a HEP.    Time 6    Period Weeks    Status New      PT LONG TERM GOAL #2   Title Walk a community distance with pain not > 3/10.    Time 6    Period Weeks    Status New      PT LONG TERM GOAL #3   Title Perform ADL's with pain not > 3/10.    Time 6    Period Weeks    Status New                  Plan - 02/01/20 1026    Clinical Impression Statement The patient presents to OPPT with c/o severe right sided low back pain wiht occasions of shooting pain  into her right posterior thigh with certain movements.  She statnd in a flexed posture and cannot achieve an upright posture at this time due to severe pain.  She is very tender to palpation over her right SIJ and gluteal musculature.  She has a burn in her mid low back and right low back region die to overusing heat.  Patient will benefit from skilled physical therapy intervention to address deficits  and pain.    Personal Factors and Comorbidities Comorbidity 1;Comorbidity 2;Comorbidity 3+    Comorbidities Lymphoma (resolved), HTN, thyroid problem, kidney problem, OA.    Examination-Activity Limitations Locomotion Level;Transfers;Other    Examination-Participation Restrictions Other    Stability/Clinical Decision Making Evolving/Moderate complexity    Clinical Decision Making Low    Rehab Potential Good    PT Frequency 2x / week    PT Duration 6 weeks    PT Treatment/Interventions ADLs/Self Care Home Management;Cryotherapy;Electrical Stimulation;Ultrasound;Traction;Moist Heat;Functional mobility training;Therapeutic activities;Therapeutic exercise;Manual techniques;Patient/family education;Dry needling;Spinal Manipulations;Joint Manipulations    PT Next Visit Plan Combo e'stim/US right low back/SIJ, STW/M, standing extension, core exercise progression.    Consulted and Agree with Plan of Care Patient           Patient will benefit from skilled therapeutic intervention in order to improve the following deficits and impairments:  Pain, Decreased activity tolerance, Decreased range of motion, Postural dysfunction, Decreased mobility  Visit Diagnosis: Acute right-sided low back pain without sciatica  Abnormal posture     Problem List Patient Active Problem List   Diagnosis Date Noted  . Chronic bilateral thoracic back pain 05/23/2018  . Paresthesia of both feet 05/23/2018  . Diffuse large B cell lymphoma ([place]) 05/20/2017  . Morbid obesity ([place]) 05/01/2016  . Proteinuria 07/21/2015  . 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 Metabolic syndrome 82/95/6213  . Primary osteoarthritis of both knees 06/10/2014  . Hypothyroidism 01/26/2014  . Essential hypertension, benign 01/26/2014  . Hyperlipidemia LDL goal <100 01/26/2014  . H/O total knee replacement 04/16/2011    Olanna Percifield, [place] MPT 02/01/2020, 10:48 AM  [facility] [address] [place], [place], [address] Phone: [phone]   Fax:  [phone]  Name: Brianna Kelly MRN: [id number] Date of Birth: 09/16/1950

## 2020-02-03 ENCOUNTER — Ambulatory Visit: Payer: Medicare HMO | Admitting: Physical Therapy

## 2020-02-03 ENCOUNTER — Other Ambulatory Visit: Payer: Self-pay

## 2020-02-03 DIAGNOSIS — M545 Low back pain, unspecified: Secondary | ICD-10-CM

## 2020-02-03 DIAGNOSIS — R293 Abnormal posture: Secondary | ICD-10-CM

## 2020-02-03 NOTE — Therapy (Signed)
[facility] [place], [place], [address] Phone: [phone]   Fax:  [phone]  Physical Therapy Treatment  Patient Details  Name: Brianna Kelly MRN: [id number] Date of Birth: [birth year] Referring Provider (PT): Devin Going MD.   Encounter Date: 02/03/2020   PT End of Session - 02/03/20 1505    Visit Number 2    Number of Visits 12    Date for PT Re-Evaluation 03/21/20    Authorization Type FOTO.    PT Start Time 1430    PT Stop Time 1511    PT Time Calculation (min) 41 min    Activity Tolerance Patient tolerated treatment well    Behavior During Therapy WFL for tasks assessed/performed           Past Medical History:  Diagnosis Date  . Allergy   . Anemia    in the past   . Anxiety    due to COVID   . Arthritis    kness, fingers  back and neck   . Cancer ([place]) 2018   large B Cell Lymphoma  . Cataract    removed both eyes  . 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 Hyperlipidemia   . Hypertension   . Kidney failure 2009    kidney function  stable now  . 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 Thyroid disease   . Vitiligo     Past Surgical History:  Procedure Laterality Date  . CATARACT EXTRACTION, BILATERAL Bilateral   . CHOLECYSTECTOMY  2001  . COLONOSCOPY    . KNEE SURGERY Right   . POLYPECTOMY    . PORT-A-CATH REMOVAL    . PORTA CATH INSERTION    . REPLACEMENT TOTAL KNEE Left 2011  . TUBAL LIGATION  1976    There were no vitals filed for this visit.   Subjective Assessment - 02/03/20 1507    Subjective Covid-19 screen performed prior to therapy session. Pt reporting increased pain in her low back more on the right side.    Pertinent History Lymphoma (resolved), HTN, thyroid problem, kidney problem, OA.    How long can you sit comfortably? Varies.    How long can you stand comfortably? Varies.    How long  can you walk comfortably? Short distances.    Currently in Pain? Yes    Pain Score 6     Pain Location Back    Pain Orientation Right;Lower    Pain Descriptors / Indicators Aching;Sore;Discomfort    Pain Type Acute pain    Pain Onset 1 to 4 weeks ago    Pain Frequency Constant                             [facility] Adult PT Treatment/Exercise - 02/03/20 0001      Exercises   Exercises Lumbar      Lumbar Exercises: Aerobic   Nustep L3 x 5 minutes      Lumbar Exercises: Seated   Long Arc Quad on Chair AROM;Strengthening;10 reps    Other Seated Lumbar Exercises hamstring curls red theraband x 10 reps, heel raises x 10     Other Seated Lumbar Exercises clams x 10 red theraband      Lumbar Exercises: Supine   Ab Set 10 reps;3 seconds    Other Supine Lumbar Exercises sit  to stand from mat table with mat elevated. Pt reporting she is unable to stand without arm rests to push up on.       Modalities   Modalities Cryotherapy;Electrical Stimulation      Cryotherapy   Number Minutes Cryotherapy 15 Minutes    Cryotherapy Location Lumbar Spine   right side   Type of Cryotherapy Ice pack      Electrical Stimulation   Electrical Stimulation Location Right SIJ/upper gluteal region.    Electrical Stimulation Action Pre-mod    Electrical Stimulation Parameters 80-150 Hz x 15 minutes    Electrical Stimulation Goals Pain                  PT Education - 02/03/20 1510    Education Details importance of mobility and strenghtening LE's to help with funcitonal mobility .    Person(s) Educated Patient    Methods Explanation;Demonstration    Comprehension Verbalized understanding;Returned demonstration               PT Long Term Goals - 02/01/20 1034      PT LONG TERM GOAL #1   Title Independent with a HEP.    Time 6    Period Weeks    Status New      PT LONG TERM GOAL #2   Title Walk a community distance with pain not > 3/10.    Time 6    Period Weeks      Status New      PT LONG TERM GOAL #3   Title Perform ADL's with pain not > 3/10.    Time 6    Period Weeks    Status New                 Plan - 02/03/20 1513    Clinical Impression Statement Pt arrving to therpay reporting 6/10 pain in her low back more on the right side. Pt initially concerned about sitting on mat table due to nothing to push up from. The mat was raised and pt performed sit to stand with CGA. Pt with SOB noted thoughout treatment. Pt was given rest breaks as needed. Continue skilled PT to progress toward LTG's.    Personal Factors and Comorbidities Comorbidity 1;Comorbidity 2;Comorbidity 3+    Comorbidities Lymphoma (resolved), HTN, thyroid problem, kidney problem, OA.    Examination-Activity Limitations Locomotion Level;Transfers;Other    Examination-Participation Restrictions Other    Stability/Clinical Decision Making Evolving/Moderate complexity    Rehab Potential Good    PT Frequency 2x / week    PT Duration 6 weeks    PT Treatment/Interventions ADLs/Self Care Home Management;Cryotherapy;Electrical Stimulation;Ultrasound;Traction;Moist Heat;Functional mobility training;Therapeutic activities;Therapeutic exercise;Manual techniques;Patient/family education;Dry needling;Spinal Manipulations;Joint Manipulations    PT Next Visit Plan Combo e'stim/US right low back/SIJ, STW/M, standing extension, core exercise progression.    Consulted and Agree with Plan of Care Patient           Patient will benefit from skilled therapeutic intervention in order to improve the following deficits and impairments:  Pain, Decreased activity tolerance, Decreased range of motion, Postural dysfunction, Decreased mobility  Visit Diagnosis: No diagnosis found.     Problem List Patient Active Problem List   Diagnosis Date Noted  . Chronic bilateral thoracic back pain 05/23/2018  . Paresthesia of both feet 05/23/2018  . 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  Morbid obesity ([place]) 05/01/2016  . Proteinuria 07/21/2015  . 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 Metabolic syndrome 63/07/6008  .  Primary osteoarthritis of both knees 06/10/2014  . Hypothyroidism 01/26/2014  . Essential hypertension, benign 01/26/2014  . Hyperlipidemia LDL goal <100 01/26/2014  . H/O total knee replacement 04/16/2011    Oretha Caprice, PT, MPT 02/03/2020, 3:30 PM  [facility] [place], [place], [address] Phone: [phone]   Fax:  [phone]  Name: Brianna Kelly MRN: [id number] Date of Birth: 01/15/1951

## 2020-02-09 ENCOUNTER — Ambulatory Visit: Payer: Medicare HMO | Admitting: Physical Therapy

## 2020-02-09 ENCOUNTER — Other Ambulatory Visit: Payer: Self-pay

## 2020-02-09 DIAGNOSIS — M545 Low back pain, unspecified: Secondary | ICD-10-CM

## 2020-02-09 DIAGNOSIS — R293 Abnormal posture: Secondary | ICD-10-CM

## 2020-02-09 NOTE — Therapy (Signed)
[facility] [place], [place], [address] Phone: [phone]   Fax:  [phone]  Physical Therapy Treatment  Patient Details  Name: Brianna Kelly MRN: [id number] Date of Birth: 06/16/1951 Referring Provider (PT): Devin Going MD.   Encounter Date: 02/09/2020   PT End of Session - 02/09/20 1428    Visit Number 3    Number of Visits 12    Date for PT Re-Evaluation 03/21/20    Authorization Type FOTO.    PT Start Time 0145    PT Stop Time 0239    PT Time Calculation (min) 54 min    Activity Tolerance Patient tolerated treatment well    Behavior During Therapy WFL for tasks assessed/performed           Past Medical History:  Diagnosis Date  . Allergy   . Anemia    in the past   . Anxiety    due to COVID   . Arthritis    kness, fingers  back and neck   . Cancer ([place]) 2018   large B Cell Lymphoma  . Cataract    removed both eyes  . 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 Hyperlipidemia   . Hypertension   . Kidney failure 2009    kidney function  stable now  . 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 Thyroid disease   . Vitiligo     Past Surgical History:  Procedure Laterality Date  . CATARACT EXTRACTION, BILATERAL Bilateral   . CHOLECYSTECTOMY  2001  . COLONOSCOPY    . KNEE SURGERY Right   . POLYPECTOMY    . PORT-A-CATH REMOVAL    . PORTA CATH INSERTION    . REPLACEMENT TOTAL KNEE Left 2011  . TUBAL LIGATION  1976    There were no vitals filed for this visit.   Subjective Assessment - 02/09/20 1424    Subjective COVID-19 screen performed prior to patient entering clinic.  Getting better.    Pertinent History Lymphoma (resolved), HTN, thyroid problem, kidney problem, OA.    How long can you sit comfortably? Varies.    How long can you stand comfortably? Varies.    How long can you walk comfortably? Short distances.     Patient Stated Goals Get out of pain.    Currently in Pain? Yes    Pain Score 5     Pain Location Back    Pain Orientation Right;Lower    Pain Descriptors / Indicators Aching;Sore;Discomfort    Pain Type Acute pain    Pain Onset 1 to 4 weeks ago                             [facility] Adult PT Treatment/Exercise - 02/09/20 0001      Modalities   Modalities Electrical Stimulation;Ultrasound      Electrical Stimulation   Electrical Stimulation Location Seated:  Right SIJ/upper gluteal region.    Electrical Stimulation Action Pre-mod.    Electrical Stimulation Parameters 80-150 Hz x 20 minutes.    Electrical Stimulation Goals Pain      Ultrasound   Ultrasound Location Left sdly position with pillow between knee for comfort:  Right SIJ/gluteal region.    Ultrasound Parameters Combo e'stim/US at 1.50 W/CM2 x 12 minutes.    Ultrasound Goals Pain  Manual Therapy   Manual Therapy Soft tissue mobilization    Soft tissue mobilization STW/M x 11 minutes to patient's right low back/SIJ/uper gluteal region to reduce tone.                       PT Long Term Goals - 02/01/20 1034      PT LONG TERM GOAL #1   Title Independent with a HEP.    Time 6    Period Weeks    Status New      PT LONG TERM GOAL #2   Title Walk a community distance with pain not > 3/10.    Time 6    Period Weeks    Status New      PT LONG TERM GOAL #3   Title Perform ADL's with pain not > 3/10.    Time 6    Period Weeks    Status New                 Plan - 02/09/20 1428    Clinical Impression Statement Patient responding well to treatments with a reduction in her pain-level and less palpable pain over her right SIj and right upper gluteal region.    Personal Factors and Comorbidities Comorbidity 1;Comorbidity 2;Comorbidity 3+    Comorbidities Lymphoma (resolved), HTN, thyroid problem, kidney problem, OA.    Examination-Activity Limitations Locomotion  Level;Transfers;Other    Examination-Participation Restrictions Other    Stability/Clinical Decision Making Evolving/Moderate complexity    Rehab Potential Good    PT Frequency 2x / week    PT Duration 6 weeks    PT Treatment/Interventions ADLs/Self Care Home Management;Cryotherapy;Electrical Stimulation;Ultrasound;Traction;Moist Heat;Functional mobility training;Therapeutic activities;Therapeutic exercise;Manual techniques;Patient/family education;Dry needling;Spinal Manipulations;Joint Manipulations    PT Next Visit Plan Combo e'stim/US right low back/SIJ, STW/M, standing extension, core exercise progression.    Consulted and Agree with Plan of Care Patient           Patient will benefit from skilled therapeutic intervention in order to improve the following deficits and impairments:  Pain, Decreased activity tolerance, Decreased range of motion, Postural dysfunction, Decreased mobility  Visit Diagnosis: Acute right-sided low back pain without sciatica  Abnormal posture     Problem List Patient Active Problem List   Diagnosis Date Noted  . Chronic bilateral thoracic back pain 05/23/2018  . Paresthesia of both feet 05/23/2018  . 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 Morbid obesity ([place]) 05/01/2016  . Proteinuria 07/21/2015  . 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 Metabolic syndrome 19/16/6060  . Primary osteoarthritis of both knees 06/10/2014  . Hypothyroidism 01/26/2014  . Essential hypertension, benign 01/26/2014  . Hyperlipidemia LDL goal <100 01/26/2014  . H/O total knee replacement 04/16/2011    Brianna Kelly, [place] MPT 02/09/2020, 3:15 PM  [facility] [address] [place], [place], [address] Phone: [phone]   Fax:  [phone]  Name: Brianna Kelly MRN: [id number] Date of Birth: [birth year]

## 2020-02-11 ENCOUNTER — Encounter: Payer: Self-pay | Admitting: Physical Therapy

## 2020-02-11 ENCOUNTER — Other Ambulatory Visit: Payer: Self-pay

## 2020-02-11 ENCOUNTER — Ambulatory Visit: Payer: Medicare HMO | Admitting: Physical Therapy

## 2020-02-11 DIAGNOSIS — R293 Abnormal posture: Secondary | ICD-10-CM

## 2020-02-11 DIAGNOSIS — M545 Low back pain, unspecified: Secondary | ICD-10-CM

## 2020-02-11 NOTE — Therapy (Signed)
[facility] [place], [place], [address] Phone: [phone]   Fax:  [phone]  Physical Therapy Treatment  Patient Details  Name: Brianna Kelly MRN: [id number] Date of Birth: January 11, 1951 Referring Provider (PT): Devin Going MD.   Encounter Date: 02/11/2020   PT End of Session - 02/11/20 1352    Visit Number 4    Number of Visits 12    Date for PT Re-Evaluation 03/21/20    Authorization Type FOTO.    PT Start Time 1352    PT Stop Time 1433    PT Time Calculation (min) 41 min    Activity Tolerance Patient tolerated treatment well    Behavior During Therapy WFL for tasks assessed/performed           Past Medical History:  Diagnosis Date  . Allergy   . Anemia    in the past   . Anxiety    due to COVID   . Arthritis    kness, fingers  back and neck   . Cancer ([place]) 2018   large B Cell Lymphoma  . Cataract    removed both eyes  . 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 Hyperlipidemia   . Hypertension   . Kidney failure 2009    kidney function  stable now  . 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 Thyroid disease   . Vitiligo     Past Surgical History:  Procedure Laterality Date  . CATARACT EXTRACTION, BILATERAL Bilateral   . CHOLECYSTECTOMY  2001  . COLONOSCOPY    . KNEE SURGERY Right   . POLYPECTOMY    . PORT-A-CATH REMOVAL    . PORTA CATH INSERTION    . REPLACEMENT TOTAL KNEE Left 2011  . TUBAL LIGATION  1976    There were no vitals filed for this visit.   Subjective Assessment - 02/11/20 1351    Subjective COVID-19 screen performed prior to patient entering clinic.  Getting better and has been using more ice and actually made a bed today.    Pertinent History Lymphoma (resolved), HTN, thyroid problem, kidney problem, OA.    How long can you sit comfortably? Varies.    How long can you stand comfortably? Varies.     How long can you walk comfortably? Short distances.    Patient Stated Goals Get out of pain.    Currently in Pain? Yes    Pain Score 6     Pain Location Back    Pain Orientation Right;Lower    Pain Descriptors / Indicators Tingling;Burning    Pain Type Acute pain    Pain Onset 1 to 4 weeks ago    Pain Frequency Constant              [facility] PT Assessment - 02/11/20 0001      Assessment   Medical Diagnosis Acute right sided low back pain with sciatica    Referring Provider (PT) Devin Going MD.      Precautions   Precautions None      Restrictions   Weight Bearing Restrictions No                         [facility] Adult PT Treatment/Exercise - 02/11/20 0001      Modalities   Modalities Electrical Stimulation;Ultrasound      Electrical Stimulation  Electrical Stimulation Location Seated:  Right SIJ/upper gluteal region.    Electrical Stimulation Action Pre-Mod    Electrical Stimulation Parameters 80-150 hz x10 min    Electrical Stimulation Goals Pain      Ultrasound   Ultrasound Location R low back   in L SL   Ultrasound Parameters Combo 1.5 w/cm2, 100%, 1 mhz x10 min    Ultrasound Goals Pain      Manual Therapy   Manual Therapy Soft tissue mobilization    Soft tissue mobilization STW to R SI joint, lumbar paraspinals, superior glute to reduce tone and pain                       PT Long Term Goals - 02/01/20 1034      PT LONG TERM GOAL #1   Title Independent with a HEP.    Time 6    Period Weeks    Status New      PT LONG TERM GOAL #2   Title Walk a community distance with pain not > 3/10.    Time 6    Period Weeks    Status New      PT LONG TERM GOAL #3   Title Perform ADL's with pain not > 3/10.    Time 6    Period Weeks    Status New                 Plan - 02/11/20 1523    Clinical Impression Statement Patient presented in clinic with reports of less R LBP and able to complete more ADLs and sleep better. Patient  presented with mod tone palpable of R glute and lumbar paraspinals. Patient did not report any tenderness or pain during treatment. Normal modalities response noted following removal of the modalities.    Personal Factors and Comorbidities Comorbidity 1;Comorbidity 2;Comorbidity 3+    Comorbidities Lymphoma (resolved), HTN, thyroid problem, kidney problem, OA.    Examination-Activity Limitations Locomotion Level;Transfers;Other    Examination-Participation Restrictions Other    Stability/Clinical Decision Making Evolving/Moderate complexity    Rehab Potential Good    PT Frequency 2x / week    PT Duration 6 weeks    PT Treatment/Interventions ADLs/Self Care Home Management;Cryotherapy;Electrical Stimulation;Ultrasound;Traction;Moist Heat;Functional mobility training;Therapeutic activities;Therapeutic exercise;Manual techniques;Patient/family education;Dry needling;Spinal Manipulations;Joint Manipulations    PT Next Visit Plan Combo e'stim/US right low back/SIJ, STW/M, standing extension, core exercise progression.    Consulted and Agree with Plan of Care Patient           Patient will benefit from skilled therapeutic intervention in order to improve the following deficits and impairments:  Pain, Decreased activity tolerance, Decreased range of motion, Postural dysfunction, Decreased mobility  Visit Diagnosis: Acute right-sided low back pain without sciatica  Abnormal posture     Problem List Patient Active Problem List   Diagnosis Date Noted  . Chronic bilateral thoracic back pain 05/23/2018  . Paresthesia of both feet 05/23/2018  . 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 Morbid obesity ([place]) 05/01/2016  . Proteinuria 07/21/2015  . 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 Metabolic syndrome 68/09/2120  . Primary osteoarthritis of both knees 06/10/2014  . Hypothyroidism 01/26/2014  . Essential hypertension, benign 01/26/2014  . Hyperlipidemia LDL goal <100  01/26/2014  . H/O total knee replacement 04/16/2011    Standley Brooking, PTA 02/11/2020, 3:28 PM  [facility] [address] [place], [place], [address] Phone: [phone]   Fax:  [phone]  Name: Neziah Vogelgesang MRN: [id number] Date of Birth: March 04, 1951

## 2020-02-15 ENCOUNTER — Other Ambulatory Visit: Payer: Self-pay

## 2020-02-15 ENCOUNTER — Ambulatory Visit: Payer: Medicare HMO | Admitting: Physical Therapy

## 2020-02-15 DIAGNOSIS — M545 Low back pain, unspecified: Secondary | ICD-10-CM

## 2020-02-15 DIAGNOSIS — R293 Abnormal posture: Secondary | ICD-10-CM

## 2020-02-15 NOTE — Patient Instructions (Signed)

## 2020-02-15 NOTE — Therapy (Signed)
[facility] [place], [place], [address] Phone: [phone]   Fax:  [phone]  Physical Therapy Treatment  Patient Details  Name: Brianna Kelly MRN: [id number] Date of Birth: November 15, 1950 Referring Provider (PT): Devin Going MD.   Encounter Date: 02/15/2020   PT End of Session - 02/15/20 1147    Visit Number 5    Number of Visits 12    Date for PT Re-Evaluation 03/21/20    Authorization Type FOTO.    PT Start Time 1116    PT Stop Time 1158    PT Time Calculation (min) 42 min    Activity Tolerance Patient tolerated treatment well    Behavior During Therapy WFL for tasks assessed/performed           Past Medical History:  Diagnosis Date  . Allergy   . Anemia    in the past   . Anxiety    due to COVID   . Arthritis    kness, fingers  back and neck   . Cancer ([place]) 2018   large B Cell Lymphoma  . Cataract    removed both eyes  . 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 Hyperlipidemia   . Hypertension   . Kidney failure 2009    kidney function  stable now  . 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 Thyroid disease   . Vitiligo     Past Surgical History:  Procedure Laterality Date  . CATARACT EXTRACTION, BILATERAL Bilateral   . CHOLECYSTECTOMY  2001  . COLONOSCOPY    . KNEE SURGERY Right   . POLYPECTOMY    . PORT-A-CATH REMOVAL    . PORTA CATH INSERTION    . REPLACEMENT TOTAL KNEE Left 2011  . TUBAL LIGATION  1976    There were no vitals filed for this visit.   Subjective Assessment - 02/15/20 1117    Subjective COVID-19 screen performed prior to patient entering clinic.  Patient arrived with less pain overall, treatments helping    Pertinent History Lymphoma (resolved), HTN, thyroid problem, kidney problem, OA.    How long can you sit comfortably? Varies.    How long can you stand comfortably? Varies.    How long  can you walk comfortably? Short distances.    Patient Stated Goals Get out of pain.    Currently in Pain? Yes    Pain Score 5     Pain Location Back    Pain Orientation Right;Lower    Pain Descriptors / Indicators Discomfort    Pain Type Acute pain    Pain Onset 1 to 4 weeks ago    Pain Frequency Constant    Aggravating Factors  certain movements    Pain Relieving Factors rest, therapy                             [facility] Adult PT Treatment/Exercise - 02/15/20 0001      Cryotherapy   Number Minutes Cryotherapy 15 Minutes    Cryotherapy Location Lumbar Spine    Type of Cryotherapy Ice pack      Electrical Stimulation   Electrical Stimulation Location Seated:  Right SIJ/upper gluteal region.    Electrical Stimulation Action premod    Electrical Stimulation Parameters 80-150hz  x52min    Electrical Stimulation Goals Pain  Ultrasound   Ultrasound Location R low back    Ultrasound Parameters combo US/ES @ 1.5w/cm2/100%/56mhz x34min    Ultrasound Goals Pain      Manual Therapy   Manual Therapy Soft tissue mobilization    Soft tissue mobilization STW to R SI joint, lumbar paraspinals, superior glute to reduce tone and pain                  PT Education - 02/15/20 1145    Education Details Posture awareness techniques    Person(s) Educated Patient    Methods Explanation;Demonstration;Handout    Comprehension Verbalized understanding;Returned demonstration               PT Long Term Goals - 02/15/20 1125      PT LONG TERM GOAL #1   Title Independent with a HEP.    Time 6    Period Weeks    Status On-going      PT LONG TERM GOAL #2   Title Walk a community distance with pain not > 3/10.    Time 6    Period Weeks    Status On-going   Patient pain ranges 5-7/10 02/15/20     PT LONG TERM GOAL #3   Title Perform ADL's with pain not > 3/10.    Time 6    Period Weeks    Status On-going   Patients pain ranges 5-7/10 02/15/20                 Plan - 02/15/20 1148    Clinical Impression Statement Patient tolerated treatment well today. Patient has reported overall progress and less pain. Patient has reported no more of the stabbing pain in a week, just sorenss in right low back. Patient was issued HEP for posture awareness techniques today to help avoid any future injury. Patient current goals ongoing due to pain limitations with ADL's and prolong activity.    Personal Factors and Comorbidities Comorbidity 1;Comorbidity 2;Comorbidity 3+    Comorbidities Lymphoma (resolved), HTN, thyroid problem, kidney problem, OA.    Examination-Activity Limitations Locomotion Level;Transfers;Other    Examination-Participation Restrictions Other    Stability/Clinical Decision Making Evolving/Moderate complexity    Rehab Potential Good    PT Frequency 2x / week    PT Duration 6 weeks    PT Treatment/Interventions ADLs/Self Care Home Management;Cryotherapy;Electrical Stimulation;Ultrasound;Traction;Moist Heat;Functional mobility training;Therapeutic activities;Therapeutic exercise;Manual techniques;Patient/family education;Dry needling;Spinal Manipulations;Joint Manipulations    PT Next Visit Plan cont with POC for Combo e'stim/US right low back/SIJ, STW/M, standing extension, core exercise progression.    Consulted and Agree with Plan of Care Patient           Patient will benefit from skilled therapeutic intervention in order to improve the following deficits and impairments:  Pain, Decreased activity tolerance, Decreased range of motion, Postural dysfunction, Decreased mobility  Visit Diagnosis: Acute right-sided low back pain without sciatica  Abnormal posture     Problem List Patient Active Problem List   Diagnosis Date Noted  . Chronic bilateral thoracic back pain 05/23/2018  . Paresthesia of both feet 05/23/2018  . 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 Morbid obesity ([place]) 05/01/2016  . Proteinuria  07/21/2015  . 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 Metabolic syndrome 32/67/1245  . Primary osteoarthritis of both knees 06/10/2014  . Hypothyroidism 01/26/2014  . Essential hypertension, benign 01/26/2014  . Hyperlipidemia LDL goal <100 01/26/2014  . H/O total knee replacement 04/16/2011    Brianna Kelly, PTA 02/15/2020, 11:59  [facility] [place], [place], [address] Phone: [phone]   Fax:  [phone]  Name: Brianna Kelly MRN: [id number] Date of Birth: 29-Feb-1951

## 2020-02-16 ENCOUNTER — Ambulatory Visit: Payer: Medicare HMO | Admitting: Physical Therapy

## 2020-02-16 ENCOUNTER — Other Ambulatory Visit: Payer: Self-pay

## 2020-02-16 ENCOUNTER — Encounter: Payer: Self-pay | Admitting: Physical Therapy

## 2020-02-16 DIAGNOSIS — R293 Abnormal posture: Secondary | ICD-10-CM

## 2020-02-16 DIAGNOSIS — M545 Low back pain, unspecified: Secondary | ICD-10-CM

## 2020-02-16 NOTE — Therapy (Signed)
[facility] [place], [place], [address] Phone: [phone]   Fax:  [phone]  Physical Therapy Treatment  Patient Details  Name: Brianna Kelly MRN: [id number] Date of Birth: 08/16/1950 Referring Provider (PT): Devin Going MD.   Encounter Date: 02/16/2020   PT End of Session - 02/16/20 1433    Visit Number 6    Number of Visits 12    Date for PT Re-Evaluation 03/21/20    Authorization Type FOTO.    PT Start Time 1434    PT Stop Time 1515    PT Time Calculation (min) 41 min    Activity Tolerance Patient tolerated treatment well    Behavior During Therapy WFL for tasks assessed/performed           Past Medical History:  Diagnosis Date  . Allergy   . Anemia    in the past   . Anxiety    due to COVID   . Arthritis    kness, fingers  back and neck   . Cancer ([place]) 2018   large B Cell Lymphoma  . Cataract    removed both eyes  . 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 Hyperlipidemia   . Hypertension   . Kidney failure 2009    kidney function  stable now  . 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 Thyroid disease   . Vitiligo     Past Surgical History:  Procedure Laterality Date  . CATARACT EXTRACTION, BILATERAL Bilateral   . CHOLECYSTECTOMY  2001  . COLONOSCOPY    . KNEE SURGERY Right   . POLYPECTOMY    . PORT-A-CATH REMOVAL    . PORTA CATH INSERTION    . REPLACEMENT TOTAL KNEE Left 2011  . TUBAL LIGATION  1976    There were no vitals filed for this visit.   Subjective Assessment - 02/16/20 1433    Subjective COVID-19 screen performed prior to patient entering clinic.  Patient arrived with less pain overall, treatments helping    Pertinent History Lymphoma (resolved), HTN, thyroid problem, kidney problem, OA.    How long can you sit comfortably? Varies.    How long can you stand comfortably? Varies.    How long  can you walk comfortably? Short distances.    Patient Stated Goals Get out of pain.    Currently in Pain? Yes    Pain Score 6     Pain Location Back    Pain Orientation Right;Lower    Pain Descriptors / Indicators Sore    Pain Type Acute pain    Pain Onset 1 to 4 weeks ago    Pain Frequency Constant              [facility] PT Assessment - 02/16/20 0001      Assessment   Medical Diagnosis Acute right sided low back pain with sciatica    Referring Provider (PT) Devin Going MD.      Precautions   Precautions None      Restrictions   Weight Bearing Restrictions No                         [facility] Adult PT Treatment/Exercise - 02/16/20 0001      Modalities   Modalities Electrical Stimulation;Ultrasound      Electrical Stimulation   Electrical Stimulation Location Seated:  Right SIJ/upper gluteal region.    Electrical Stimulation Action Pre-Mod    Electrical Stimulation Parameters 80-150 hz x15 min    Electrical Stimulation Goals Pain      Ultrasound   Ultrasound Location R low back    Ultrasound Parameters Combo 1.5 w/cm2, 100%, 1 mhz x10 min    Ultrasound Goals Pain      Manual Therapy   Manual Therapy Soft tissue mobilization    Soft tissue mobilization STW to R SI joint, lumbar paraspinals, superior glute to reduce tone and pain                       PT Long Term Goals - 02/15/20 1125      PT LONG TERM GOAL #1   Title Independent with a HEP.    Time 6    Period Weeks    Status On-going      PT LONG TERM GOAL #2   Title Walk a community distance with pain not > 3/10.    Time 6    Period Weeks    Status On-going   Patient pain ranges 5-7/10 02/15/20     PT LONG TERM GOAL #3   Title Perform ADL's with pain not > 3/10.    Time 6    Period Weeks    Status On-going   Patients pain ranges 5-7/10 02/15/20                Plan - 02/16/20 1516    Clinical Impression Statement Patient presented in clinic with continued R low back  soreness. Patient no longer experiencing shooting, sharp pain in R low back. Min-mod tone palpable of R lumbar paraspinals, superior glute with no complaints of increased tenderness reported during manual therapy session. Normal modalities response noted following removal of the modalities.    Personal Factors and Comorbidities Comorbidity 1;Comorbidity 2;Comorbidity 3+    Comorbidities Lymphoma (resolved), HTN, thyroid problem, kidney problem, OA.    Examination-Activity Limitations Locomotion Level;Transfers;Other    Examination-Participation Restrictions Other    Stability/Clinical Decision Making Evolving/Moderate complexity    Rehab Potential Good    PT Frequency 2x / week    PT Duration 6 weeks    PT Treatment/Interventions ADLs/Self Care Home Management;Cryotherapy;Electrical Stimulation;Ultrasound;Traction;Moist Heat;Functional mobility training;Therapeutic activities;Therapeutic exercise;Manual techniques;Patient/family education;Dry needling;Spinal Manipulations;Joint Manipulations    PT Next Visit Plan cont with POC for Combo e'stim/US right low back/SIJ, STW/M, standing extension, core exercise progression.    Consulted and Agree with Plan of Care Patient           Patient will benefit from skilled therapeutic intervention in order to improve the following deficits and impairments:  Pain, Decreased activity tolerance, Decreased range of motion, Postural dysfunction, Decreased mobility  Visit Diagnosis: Acute right-sided low back pain without sciatica  Abnormal posture     Problem List Patient Active Problem List   Diagnosis Date Noted  . Chronic bilateral thoracic back pain 05/23/2018  . Paresthesia of both feet 05/23/2018  . 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 Morbid obesity ([place]) 05/01/2016  . Proteinuria 07/21/2015  . 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 Metabolic syndrome 42/59/5638  . Primary osteoarthritis of both knees 06/10/2014  .  Hypothyroidism 01/26/2014  . Essential hypertension, benign 01/26/2014  . Hyperlipidemia LDL goal <100 01/26/2014  . H/O total knee replacement 04/16/2011    Standley Brooking, PTA 02/16/2020, 3:19 PM  [facility] [place], [place],  [place] Phone: [phone]   Fax:  [phone]  Name: Brianna Kelly MRN: [id number] Date of Birth: 1951/01/18

## 2020-02-19 ENCOUNTER — Encounter: Payer: Medicare HMO | Admitting: Physical Therapy

## 2020-03-04 ENCOUNTER — Other Ambulatory Visit: Payer: Self-pay

## 2020-03-04 DIAGNOSIS — I10 Essential (primary) hypertension: Secondary | ICD-10-CM

## 2020-03-04 MED ORDER — BENAZEPRIL HCL 10 MG PO TABS: 10.0000 mg | ORAL_TABLET | Freq: Every day | ORAL | 0 refills | Status: AC

## 2020-05-04 ENCOUNTER — Telehealth: Payer: Self-pay | Admitting: *Deleted

## 2020-05-04 NOTE — Telephone Encounter (Signed)
  Chronic Care Management   Note  05/04/2020 Name: Brianna Kelly MRN: [id number] DOB: 1951/02/11  Patient had 3 unsuccessful outreach attempts and should have been removed from CCM program at that time. Removing from program today. Patient can be added back if she or provider wishes.   Follow up plan: No follow-up needed  Chong Sicilian, BSN, RN-BC [facility] Direct Dial: [phone]   g

## 2020-08-25 ENCOUNTER — Telehealth: Payer: Self-pay

## 2020-08-25 NOTE — Telephone Encounter (Signed)
Transition Care Management Follow-up Telephone Call  Date of discharge and from where: 08/24/20 from [facility]  How have you been since you were released from the hospital? Pt states that she is a little swollen but not pain that is unbearable.   Any questions or concerns? No  Items Reviewed:  Did the pt receive and understand the discharge instructions provided? Yes   Medications obtained and verified? Yes   Other? No   Any new allergies since your discharge? No   Dietary orders reviewed? N/A  Do you have support at home? Yes   Functional Questionnaire: (I = Independent and D = Dependent) ADLs: I  Bathing/Dressing- I  Meal Prep- I  Eating- I  Maintaining continence- I  Transferring/Ambulation- I  Managing Meds- I   Follow up appointments reviewed:   Are transportation arrangements needed? No   If their condition worsens, is the pt aware to call PCP or go to the Emergency Dept.? Yes  Was the patient provided with contact information for the PCP's office or ED? Yes  Was to pt encouraged to call back with questions or concerns? Yes

## 2021-08-17 ENCOUNTER — Telehealth: Payer: Self-pay

## 2021-08-17 NOTE — Telephone Encounter (Signed)
Transition Care Management Unsuccessful Follow-up Telephone Call  Date of discharge and from where:  [place] 08-16-21 Dx: pneumonia  Attempts:  1st Attempt  Reason for unsuccessful TCM follow-up call:  Left voice message

## 2021-08-18 NOTE — Telephone Encounter (Signed)
Transition Care Management Unsuccessful Follow-up Telephone Call  Date of discharge and from where:  08/16/21 [facility] - pneumonia  Attempts:  2nd Attempt  Reason for unsuccessful TCM follow-up call:  Left voice message  This patient hasn't been to our office since 09/2019 - about 2 years

## 2022-04-19 ENCOUNTER — Encounter: Payer: Self-pay | Admitting: Gastroenterology

## 2022-07-13 ENCOUNTER — Telehealth: Payer: Self-pay

## 2022-07-13 NOTE — Telephone Encounter (Signed)
Transition Care Management Unsuccessful Follow-up Telephone Call  Date of discharge and from where:  07/12/2022 [facility] ED   Attempts:  1st Attempt  Reason for unsuccessful TCM follow-up call:  No answer/busy - VM not set up

## 2022-07-20 NOTE — Telephone Encounter (Signed)
Transition Care Management Unsuccessful Follow-up Telephone Call  Date of discharge and from where:  07/12/22 [facility] ED  Attempts:  2nd Attempt  Reason for unsuccessful TCM follow-up call:  Unable to leave message voicemail not set up

## 2022-07-26 NOTE — Telephone Encounter (Signed)
Transition Care Management Unsuccessful Follow-up Telephone Call  Date of discharge and from where:  07/12/22 [facility] ED  Attempts:  3rd Attempt  Reason for unsuccessful TCM follow-up call:  Unable to reach patient - letter mailed
